# Patient Record
Sex: Female | Born: 1990 | Race: Asian | Hispanic: No | Marital: Married | Smoking: Never smoker
Health system: Southern US, Community
[De-identification: ages and names within clinical notes are randomized; demographics above are authoritative.]

## PROBLEM LIST (undated history)

## (undated) ENCOUNTER — Inpatient Hospital Stay (HOSPITAL_COMMUNITY): Payer: Self-pay

## (undated) DIAGNOSIS — R519 Headache, unspecified: Secondary | ICD-10-CM

## (undated) DIAGNOSIS — A749 Chlamydial infection, unspecified: Secondary | ICD-10-CM

## (undated) DIAGNOSIS — R51 Headache: Secondary | ICD-10-CM

## (undated) DIAGNOSIS — I10 Essential (primary) hypertension: Secondary | ICD-10-CM

## (undated) HISTORY — DX: Essential (primary) hypertension: I10

## (undated) HISTORY — PX: WISDOM TOOTH EXTRACTION: SHX21

## (undated) HISTORY — PX: TONSILLECTOMY: SUR1361

---

## 2017-05-15 DIAGNOSIS — I10 Essential (primary) hypertension: Secondary | ICD-10-CM

## 2017-05-15 HISTORY — DX: Essential (primary) hypertension: I10

## 2017-05-19 ENCOUNTER — Encounter (HOSPITAL_COMMUNITY): Payer: Self-pay | Admitting: *Deleted

## 2017-05-19 ENCOUNTER — Inpatient Hospital Stay (HOSPITAL_COMMUNITY)
Admission: AD | Admit: 2017-05-19 | Discharge: 2017-05-19 | Disposition: A | Discharge status: Home / Self Care | Payer: Medicaid Other | Source: Ambulatory Visit | Attending: Obstetrics and Gynecology | Admitting: Obstetrics and Gynecology

## 2017-05-19 ENCOUNTER — Inpatient Hospital Stay (HOSPITAL_COMMUNITY): Payer: Medicaid Other

## 2017-05-19 DIAGNOSIS — O468X1 Other antepartum hemorrhage, first trimester: Secondary | ICD-10-CM | POA: Diagnosis not present

## 2017-05-19 DIAGNOSIS — O468X9 Other antepartum hemorrhage, unspecified trimester: Secondary | ICD-10-CM | POA: Diagnosis present

## 2017-05-19 DIAGNOSIS — O209 Hemorrhage in early pregnancy, unspecified: Secondary | ICD-10-CM | POA: Diagnosis present

## 2017-05-19 DIAGNOSIS — O418X10 Other specified disorders of amniotic fluid and membranes, first trimester, not applicable or unspecified: Secondary | ICD-10-CM

## 2017-05-19 DIAGNOSIS — O200 Threatened abortion: Secondary | ICD-10-CM | POA: Insufficient documentation

## 2017-05-19 DIAGNOSIS — Z9889 Other specified postprocedural states: Secondary | ICD-10-CM | POA: Insufficient documentation

## 2017-05-19 DIAGNOSIS — Z8249 Family history of ischemic heart disease and other diseases of the circulatory system: Secondary | ICD-10-CM | POA: Diagnosis not present

## 2017-05-19 DIAGNOSIS — O418X90 Other specified disorders of amniotic fluid and membranes, unspecified trimester, not applicable or unspecified: Secondary | ICD-10-CM | POA: Diagnosis present

## 2017-05-19 DIAGNOSIS — O208 Other hemorrhage in early pregnancy: Secondary | ICD-10-CM | POA: Diagnosis not present

## 2017-05-19 DIAGNOSIS — Z888 Allergy status to other drugs, medicaments and biological substances status: Secondary | ICD-10-CM | POA: Diagnosis not present

## 2017-05-19 DIAGNOSIS — Z3A01 Less than 8 weeks gestation of pregnancy: Secondary | ICD-10-CM | POA: Diagnosis not present

## 2017-05-19 DIAGNOSIS — O26899 Other specified pregnancy related conditions, unspecified trimester: Secondary | ICD-10-CM

## 2017-05-19 DIAGNOSIS — R109 Unspecified abdominal pain: Secondary | ICD-10-CM

## 2017-05-19 HISTORY — DX: Chlamydial infection, unspecified: A74.9

## 2017-05-19 HISTORY — DX: Headache, unspecified: R51.9

## 2017-05-19 HISTORY — DX: Headache: R51

## 2017-05-19 LAB — HCG, QUANTITATIVE, PREGNANCY: hCG, Beta Chain, Quant, S: 17574 m[IU]/mL — ABNORMAL HIGH (ref <5)

## 2017-05-19 LAB — URINALYSIS, ROUTINE W REFLEX MICROSCOPIC
BILIRUBIN URINE: NEGATIVE
GLUCOSE, UA: NEGATIVE mg/dL
Ketones, ur: NEGATIVE mg/dL
NITRITE: NEGATIVE
Protein, ur: 30 mg/dL — AB
SPECIFIC GRAVITY, URINE: 1.019 (ref 1.005–1.030)
pH: 5 (ref 5.0–8.0)

## 2017-05-19 LAB — CBC
HEMATOCRIT: 39.5 % (ref 36.0–46.0)
HEMOGLOBIN: 13.3 g/dL (ref 12.0–15.0)
MCH: 29.4 pg (ref 26.0–34.0)
MCHC: 33.7 g/dL (ref 30.0–36.0)
MCV: 87.4 fL (ref 78.0–100.0)
Platelets: 246 10*3/uL (ref 150–400)
RBC: 4.52 MIL/uL (ref 3.87–5.11)
RDW: 13.4 % (ref 11.5–15.5)
WBC: 9.3 10*3/uL (ref 4.0–10.5)

## 2017-05-19 LAB — WET PREP, GENITAL
CLUE CELLS WET PREP: NONE SEEN
SPERM: NONE SEEN
Trich, Wet Prep: NONE SEEN
Yeast Wet Prep HPF POC: NONE SEEN

## 2017-05-19 LAB — POCT PREGNANCY, URINE: Preg Test, Ur: POSITIVE — AB

## 2017-05-19 LAB — ABO/RH: ABO/RH(D): O POS

## 2017-05-19 NOTE — MAU Provider Note (Signed)
History     CSN: [id number]  Arrival date and time: 05/19/17 2423   First Provider Initiated Contact with Patient 05/19/17 [phone]      Chief Complaint  Patient presents with  . Abdominal Pain  . Vaginal Bleeding  . Possible Pregnancy   HPI  [name] is a 26 yo G2P1001 at 6.[redacted] wks gestation by LMP presenting to MAU with complaints of bad abd cramping x 2 wks and spotting since 9/1.  She reports the bleeding has increased to more like a period this AM.  Past Medical History:  Diagnosis Date  . Chlamydia   . Headache     Past Surgical History:  Procedure Laterality Date  . TONSILLECTOMY    . WISDOM TOOTH EXTRACTION      Family History  Problem Relation Age of Onset  . Heart disease Father     Social History  Substance Use Topics  . Smoking status: Never Smoker  . Smokeless tobacco: Never Used  . Alcohol use Yes     Comment: rarely    Allergies:  Allergies  Allergen Reactions  . Cefzil [Cefprozil] Hives    Coughing, fever    No prescriptions prior to admission.    Review of Systems  Constitutional: Negative.   HENT: Negative.   Eyes: Negative.   Respiratory: Negative.   Cardiovascular: Negative.   Gastrointestinal: Positive for abdominal pain.  Endocrine: Negative.   Genitourinary: Positive for vaginal bleeding.  Musculoskeletal: Negative.   Skin: Negative.   Allergic/Immunologic: Negative.   Neurological: Negative.   Hematological: Negative.   Psychiatric/Behavioral: Negative.    Physical Exam   Blood pressure 140/71, pulse 75, temperature 98 F (36.7 C), temperature source Oral, resp. rate 16, height 5\' 5"  (1.651 m), weight 109.3 kg (241 lb), last menstrual period 04/07/2017, SpO2 100 %.  Physical Exam  Constitutional: She is oriented to person, place, and time. She appears well-developed and well-nourished.  HENT:  Head: Normocephalic.  Eyes: Pupils are equal, round, and reactive to light.  Neck: Normal range of motion.   Cardiovascular: Normal rate, regular rhythm and normal heart sounds.   Respiratory: Effort normal and breath sounds normal.  GI: Soft. Bowel sounds are normal.  Musculoskeletal: Normal range of motion.  Neurological: She is alert and oriented to person, place, and time.  Skin: Skin is warm and dry.  Psychiatric: She has a normal mood and affect. Her behavior is normal. Judgment and thought content normal.    MAU Course  Procedures  MDM CCUA UPT CBC ABO/Rh HCG OB U/S <14 wks OB U/S Transvaginal  Results for orders placed or performed during the hospital encounter of 05/19/17 (from the past 24 hour(s))  Urinalysis, Routine w reflex microscopic     Status: Abnormal   Collection Time: 05/19/17  8:59 AM  Result Value Ref Range   Color, Urine YELLOW YELLOW   APPearance CLOUDY (A) CLEAR   Specific Gravity, Urine 1.019 1.005 - 1.030   pH 5.0 5.0 - 8.0   Glucose, UA NEGATIVE NEGATIVE mg/dL   Hgb urine dipstick LARGE (A) NEGATIVE   Bilirubin Urine NEGATIVE NEGATIVE   Ketones, ur NEGATIVE NEGATIVE mg/dL   Protein, ur 30 (A) NEGATIVE mg/dL   Nitrite NEGATIVE NEGATIVE   Leukocytes, UA LARGE (A) NEGATIVE   RBC / HPF 6-30 0 - 5 RBC/hpf   WBC, UA TOO NUMEROUS TO COUNT 0 - 5 WBC/hpf   Bacteria, UA FEW (A) NONE SEEN   Squamous Epithelial / LPF 6-30 (A)  NONE SEEN   Mucus PRESENT   Pregnancy, urine POC     Status: Abnormal   Collection Time: 05/19/17  9:03 AM  Result Value Ref Range   Preg Test, Ur POSITIVE (A) NEGATIVE  CBC     Status: None   Collection Time: 05/19/17  9:36 AM  Result Value Ref Range   WBC 9.3 4.0 - 10.5 K/uL   RBC 4.52 3.87 - 5.11 MIL/uL   Hemoglobin 13.3 12.0 - 15.0 g/dL   HCT 39.5 36.0 - 46.0 %   MCV 87.4 78.0 - 100.0 fL   MCH 29.4 26.0 - 34.0 pg   MCHC 33.7 30.0 - 36.0 g/dL   RDW 13.4 11.5 - 15.5 %   Platelets 246 150 - 400 K/uL  ABO/Rh     Status: None (Preliminary result)   Collection Time: 05/19/17  9:36 AM  Result Value Ref Range   ABO/RH(D) O POS    hCG, quantitative, pregnancy     Status: Abnormal   Collection Time: 05/19/17  9:36 AM  Result Value Ref Range   hCG, Beta Chain, Quant, S 17,574 (H) <5 mIU/mL  Wet prep, genital     Status: Abnormal   Collection Time: 05/19/17  9:36 AM  Result Value Ref Range   Yeast Wet Prep HPF POC NONE SEEN NONE SEEN   Trich, Wet Prep NONE SEEN NONE SEEN   Clue Cells Wet Prep HPF POC NONE SEEN NONE SEEN   WBC, Wet Prep HPF POC MANY (A) NONE SEEN   Sperm NONE SEEN    US Ob Comp Less 14 Wks  Result Date: 05/19/2017 CLINICAL DATA:  Bleeding in early pregnancy, abdominal pain in pregnancy, quantitative beta HCG = 17,574 EXAM: OBSTETRIC <14 WK [place] AND TRANSVAGINAL OB US TECHNIQUE: Both transabdominal and transvaginal ultrasound examinations were performed for complete evaluation of the gestation as well as the maternal uterus, adnexal regions, and pelvic cul-de-sac. Transvaginal technique was performed to assess early pregnancy. COMPARISON:  None FINDINGS: Intrauterine gestational sac: Present Yolk sac:  Present Embryo:  Present Cardiac Activity: Present Heart Rate: 116  bpm CRL:  2.4  mm   5 w   5 d                  [place] EDC: 01/14/2018 Subchorionic hemorrhage:  Large subchronic hemorrhage Maternal uterus/adnexae: RIGHT ovary normal size and morphology 3.0 x 3.4 x 3.1 cm. LEFT ovary normal size and morphology 2.2 x 2.8 x 1.7 cm. No free pelvic fluid or adnexal masses. IMPRESSION: Single live intrauterine gestation as above. Large subchronic hemorrhage. Electronically Signed   By: Lavonia Dana M.D.   On: 05/19/2017 11:15   US Ob Transvaginal  Result Date: 05/19/2017 CLINICAL DATA:  Bleeding in early pregnancy, abdominal pain in pregnancy, quantitative beta HCG = 17,574 EXAM: OBSTETRIC <14 WK [place] AND TRANSVAGINAL OB US TECHNIQUE: Both transabdominal and transvaginal ultrasound examinations were performed for complete evaluation of the gestation as well as the maternal uterus, adnexal regions, and pelvic cul-de-sac.  Transvaginal technique was performed to assess early pregnancy. COMPARISON:  None FINDINGS: Intrauterine gestational sac: Present Yolk sac:  Present Embryo:  Present Cardiac Activity: Present Heart Rate: 116  bpm CRL:  2.4  mm   5 w   5 d                  [place] EDC: 01/14/2018 Subchorionic hemorrhage:  Large subchronic hemorrhage Maternal uterus/adnexae: RIGHT ovary normal size and morphology 3.0 x 3.4 x  3.1 cm. LEFT ovary normal size and morphology 2.2 x 2.8 x 1.7 cm. No free pelvic fluid or adnexal masses. IMPRESSION: Single live intrauterine gestation as above. Large subchronic hemorrhage. Electronically Signed   By: Lavonia Dana M.D.   On: 05/19/2017 11:15    Assessment and Plan  Subchorionic hemorrhage of placenta in first trimester, single or unspecified fetus - Instructions on [facility] and threatened miscarriage given - Bleeding precautions discussed  Abdominal pain affecting pregnancy - Advised to take Tylenol 1000 mg every 6 hrs prn pain - Advised to return to MAU for increased pain not relieved by Tylenol  Discharge home Patient verbalized an understanding of the plan of care and agrees.   Laury Deep, MSN, CNM 05/19/2017, 9:39 AM

## 2017-05-19 NOTE — MAU Note (Signed)
+  HPT last Friday.  Started spotting on Sat. Has had bad cramps for the past 2 wks.  Bleeding increased more like a period this morning.

## 2017-05-19 NOTE — Discharge Instructions (Signed)
You can take Tylenol 1000 mg every 6 hours as needed for pain   [place] for [employer] at [facility]       Phone: [facility] for [place] at [place] Phone: [place] for [place] at [place]  Phone: [place] for [place] at [facility]  Phone: [place] for [place] at [place]  Phone: [facility]       Phone: [phone]  [facility]    Phone: [phone]   [facility] [place])    Phone: [facility]    Phone: [phone]  [facility]    Phone: [phone]  [place]    Phone: [phone]  [place] [facility]       Phone: [phone]   [place] [facility]  Phone: [place]    Phone: [phone]  [facility]   Phone: [phone]  [facility]      Phone: [phone]  [facility]    Phone: [phone]

## 2017-05-20 LAB — GC/CHLAMYDIA PROBE AMP (~~LOC~~) NOT AT ARMC
Chlamydia: NEGATIVE
Neisseria Gonorrhea: NEGATIVE

## 2017-06-09 ENCOUNTER — Encounter (HOSPITAL_COMMUNITY): Payer: Self-pay | Admitting: *Deleted

## 2017-06-09 ENCOUNTER — Inpatient Hospital Stay (HOSPITAL_COMMUNITY)
Admission: AD | Admit: 2017-06-09 | Discharge: 2017-06-09 | Disposition: A | Discharge status: Home / Self Care | Payer: Medicaid Other | Source: Ambulatory Visit | Attending: Family Medicine | Admitting: Family Medicine

## 2017-06-09 DIAGNOSIS — Z3A09 9 weeks gestation of pregnancy: Secondary | ICD-10-CM | POA: Diagnosis not present

## 2017-06-09 DIAGNOSIS — N930 Postcoital and contact bleeding: Secondary | ICD-10-CM

## 2017-06-09 DIAGNOSIS — O209 Hemorrhage in early pregnancy, unspecified: Secondary | ICD-10-CM | POA: Diagnosis not present

## 2017-06-09 DIAGNOSIS — O26851 Spotting complicating pregnancy, first trimester: Secondary | ICD-10-CM | POA: Diagnosis present

## 2017-06-09 LAB — URINALYSIS, ROUTINE W REFLEX MICROSCOPIC
BILIRUBIN URINE: NEGATIVE
Glucose, UA: NEGATIVE mg/dL
Ketones, ur: NEGATIVE mg/dL
Nitrite: NEGATIVE
PROTEIN: 30 mg/dL — AB
SPECIFIC GRAVITY, URINE: 1.021 (ref 1.005–1.030)
pH: 6 (ref 5.0–8.0)

## 2017-06-09 NOTE — MAU Note (Signed)
Started having vaginal bleeding again last night; wearing a pad; more noticeable when wipes. Red in color. No clots.   Abdominal cramping--rating pain 2/10

## 2017-06-09 NOTE — MAU Provider Note (Signed)
History     CSN: [id number]  Arrival date and time: 06/09/17 0909   First Provider Initiated Contact with Patient 06/09/17 [phone]      Chief Complaint  Patient presents with  . Vaginal Bleeding   Amber Dominguez is a 26 y.o. G2P1001 at [redacted]w[redacted]d who presents today with spotting since yesterday. She has had intercourse with in the 24 hours prior to the bleeding starting. She has minimal pain. She has documented IUP on [place] on 05/19/17.    Vaginal Bleeding  The patient's primary symptoms include pelvic pain and vaginal bleeding. This is a new problem. The current episode started yesterday. The problem occurs constantly. The problem has been unchanged. Pain severity now: 3/10  She is pregnant. Associated symptoms include nausea. Pertinent negatives include no chills, dysuria, fever, frequency, urgency or vomiting. The vaginal discharge was bloody. The vaginal bleeding is lighter than menses. She has not been passing clots. She has not been passing tissue. Nothing aggravates the symptoms. She has tried nothing for the symptoms. Sexual activity: has had intercourse in the last 48 hours.  Menstrual history: LMP 04/07/17     Past Medical History:  Diagnosis Date  . Chlamydia   . Headache     Past Surgical History:  Procedure Laterality Date  . TONSILLECTOMY    . WISDOM TOOTH EXTRACTION      Family History  Problem Relation Age of Onset  . Heart disease Father     Social History  Substance Use Topics  . Smoking status: Never Smoker  . Smokeless tobacco: Never Used  . Alcohol use Yes     Comment: rarely    Allergies:  Allergies  Allergen Reactions  . Cefzil [Cefprozil] Hives    Coughing, fever    Prescriptions Prior to Admission  Medication Sig Dispense Refill Last Dose  . acetaminophen (TYLENOL) 500 MG tablet Take 1,000 mg by mouth every 6 (six) hours as needed for mild pain or headache.   06/08/2017 at Unknown time  . Prenatal Vit-Fe Fumarate-FA (PRENATAL MULTIVITAMIN) TABS  tablet Take 1 tablet by mouth daily at 12 noon.   06/09/2017 at Unknown time    Review of Systems  Constitutional: Negative for chills and fever.  Gastrointestinal: Positive for nausea. Negative for vomiting.  Genitourinary: Positive for pelvic pain and vaginal bleeding. Negative for dysuria, frequency and urgency.   Physical Exam   Blood pressure (!) 141/64, pulse (!) 110, temperature 98.2 F (36.8 C), temperature source Oral, resp. rate 16, weight 238 lb 0.6 oz (108 kg), last menstrual period 04/07/2017, SpO2 100 %.  Physical Exam  Nursing note and vitals reviewed. Constitutional: She is oriented to person, place, and time. She appears well-developed and well-nourished. No distress.  HENT:  Head: Normocephalic.  Cardiovascular: Normal rate.   Respiratory: Effort normal.  GI: Soft. There is no tenderness. There is no rebound.  Neurological: She is alert and oriented to person, place, and time.  Skin: Skin is warm and dry.  Psychiatric: She has a normal mood and affect.   Bedside US: +[facility] MAU Course  Procedures  MDM   Assessment and Plan   1. Vaginal bleeding in pregnancy, first trimester   2. Postcoital bleeding   3. [redacted] weeks gestation of pregnancy    DC home Comfort measures reviewed  1st Trimester precautions  Bleeding precautions RX: none  Return to MAU as needed FU with OB as planned  [place] Follow up.  Specialty:  Obstetrics and Gynecology Contact information: [address] [place] 06/09/2017, 10:22 AM

## 2017-06-09 NOTE — Discharge Instructions (Signed)
First Trimester of Pregnancy The first trimester of pregnancy is from week 1 until the end of week 13 (months 1 through 3). A week after a sperm fertilizes an egg, the egg will implant on the wall of the uterus. This embryo will begin to develop into a baby. Genes from you and your partner will form the baby. The female genes will determine whether the baby will be a boy or a girl. At 6-8 weeks, the eyes and face will be formed, and the heartbeat can be seen on ultrasound. At the end of 12 weeks, all the baby's organs will be formed. Now that you are pregnant, you will want to do everything you can to have a healthy baby. Two of the most important things are to get good prenatal care and to follow your health care provider's instructions. Prenatal care is all the medical care you receive before the baby's birth. This care will help prevent, find, and treat any problems during the pregnancy and childbirth. Body changes during your first trimester Your body goes through many changes during pregnancy. The changes vary from woman to woman.  You may gain or lose a couple of pounds at first.  You may feel sick to your stomach (nauseous) and you may throw up (vomit). If the vomiting is uncontrollable, call your health care provider.  You may tire easily.  You may develop headaches that can be relieved by medicines. All medicines should be approved by your health care provider.  You may urinate more often. Painful urination may mean you have a bladder infection.  You may develop heartburn as a result of your pregnancy.  You may develop constipation because certain hormones are causing the muscles that push stool through your intestines to slow down.  You may develop hemorrhoids or swollen veins (varicose veins).  Your breasts may begin to grow larger and become tender. Your nipples may stick out more, and the tissue that surrounds them (areola) may become darker.  Your gums may bleed and may be  sensitive to brushing and flossing.  Dark spots or blotches (chloasma, mask of pregnancy) may develop on your face. This will likely fade after the baby is born.  Your menstrual periods will stop.  You may have a loss of appetite.  You may develop cravings for certain kinds of food.  You may have changes in your emotions from day to day, such as being excited to be pregnant or being concerned that something may go wrong with the pregnancy and baby.  You may have more vivid and strange dreams.  You may have changes in your hair. These can include thickening of your hair, rapid growth, and changes in texture. Some women also have hair loss during or after pregnancy, or hair that feels dry or thin. Your hair will most likely return to normal after your baby is born.  What to expect at prenatal visits During a routine prenatal visit:  You will be weighed to make sure you and the baby are growing normally.  Your blood pressure will be taken.  Your abdomen will be measured to track your baby's growth.  The fetal heartbeat will be listened to between weeks 10 and 14 of your pregnancy.  Test results from any previous visits will be discussed.  Your health care provider may ask you:  How you are feeling.  If you are feeling the baby move.  If you have had any abnormal symptoms, such as leaking fluid, bleeding, severe headaches,   or abdominal cramping.  If you are using any tobacco products, including cigarettes, chewing tobacco, and electronic cigarettes.  If you have any questions.  Other tests that may be performed during your first trimester include:  Blood tests to find your blood type and to check for the presence of any previous infections. The tests will also be used to check for low iron levels (anemia) and protein on red blood cells (Rh antibodies). Depending on your risk factors, or if you previously had diabetes during pregnancy, you may have tests to check for high blood  sugar that affects pregnant women (gestational diabetes).  Urine tests to check for infections, diabetes, or protein in the urine.  An ultrasound to confirm the proper growth and development of the baby.  Fetal screens for spinal cord problems (spina bifida) and Down syndrome.  HIV (human immunodeficiency virus) testing. Routine prenatal testing includes screening for HIV, unless you choose not to have this test.  You may need other tests to make sure you and the baby are doing well.  Follow these instructions at home: Medicines  Follow your health care provider's instructions regarding medicine use. Specific medicines may be either safe or unsafe to take during pregnancy.  Take a prenatal vitamin that contains at least 600 micrograms (mcg) of folic acid.  If you develop constipation, try taking a stool softener if your health care provider approves. Eating and drinking  Eat a balanced diet that includes fresh fruits and vegetables, whole grains, good sources of protein such as meat, eggs, or tofu, and low-fat dairy. Your health care provider will help you determine the amount of weight gain that is right for you.  Avoid raw meat and uncooked cheese. These carry germs that can cause birth defects in the baby.  Eating four or five small meals rather than three large meals a day may help relieve nausea and vomiting. If you start to feel nauseous, eating a few soda crackers can be helpful. Drinking liquids between meals, instead of during meals, also seems to help ease nausea and vomiting.  Limit foods that are high in fat and processed sugars, such as fried and sweet foods.  To prevent constipation: ? Eat foods that are high in fiber, such as fresh fruits and vegetables, whole grains, and beans. ? Drink enough fluid to keep your urine clear or pale yellow. Activity  Exercise only as directed by your health care provider. Most women can continue their usual exercise routine during  pregnancy. Try to exercise for 30 minutes at least 5 days a week. Exercising will help you: ? Control your weight. ? Stay in shape. ? Be prepared for labor and delivery.  Experiencing pain or cramping in the lower abdomen or lower back is a good sign that you should stop exercising. Check with your health care provider before continuing with normal exercises.  Try to avoid standing for long periods of time. Move your legs often if you must stand in one place for a long time.  Avoid heavy lifting.  Wear low-heeled shoes and practice good posture.  You may continue to have sex unless your health care provider tells you not to. Relieving pain and discomfort  Wear a good support bra to relieve breast tenderness.  Take warm sitz baths to soothe any pain or discomfort caused by hemorrhoids. Use hemorrhoid cream if your health care provider approves.  Rest with your legs elevated if you have leg cramps or low back pain.  If you develop   varicose veins in your legs, wear support hose. Elevate your feet for 15 minutes, 3-4 times a day. Limit salt in your diet. Prenatal care  Schedule your prenatal visits by the twelfth week of pregnancy. They are usually scheduled monthly at first, then more often in the last 2 months before delivery.  Write down your questions. Take them to your prenatal visits.  Keep all your prenatal visits as told by your health care provider. This is important. Safety  Wear your seat belt at all times when driving.  Make a list of emergency phone numbers, including numbers for family, friends, the hospital, and police and fire departments. General instructions  Ask your health care provider for a referral to a local prenatal education class. Begin classes no later than the beginning of month 6 of your pregnancy.  Ask for help if you have counseling or nutritional needs during pregnancy. Your health care provider can offer advice or refer you to specialists for help  with various needs.  Do not use hot tubs, steam rooms, or saunas.  Do not douche or use tampons or scented sanitary pads.  Do not cross your legs for long periods of time.  Avoid cat litter boxes and soil used by cats. These carry germs that can cause birth defects in the baby and possibly loss of the fetus by miscarriage or stillbirth.  Avoid all smoking, herbs, alcohol, and medicines not prescribed by your health care provider. Chemicals in these products affect the formation and growth of the baby.  Do not use any products that contain nicotine or tobacco, such as cigarettes and e-cigarettes. If you need help quitting, ask your health care provider. You may receive counseling support and other resources to help you quit.  Schedule a dentist appointment. At home, brush your teeth with a soft toothbrush and be gentle when you floss. Contact a health care provider if:  You have dizziness.  You have mild pelvic cramps, pelvic pressure, or nagging pain in the abdominal area.  You have persistent nausea, vomiting, or diarrhea.  You have a bad smelling vaginal discharge.  You have pain when you urinate.  You notice increased swelling in your face, hands, legs, or ankles.  You are exposed to fifth disease or chickenpox.  You are exposed to German measles (rubella) and have never had it. Get help right away if:  You have a fever.  You are leaking fluid from your vagina.  You have spotting or bleeding from your vagina.  You have severe abdominal cramping or pain.  You have rapid weight gain or loss.  You vomit blood or material that looks like coffee grounds.  You develop a severe headache.  You have shortness of breath.  You have any kind of trauma, such as from a fall or a car accident. Summary  The first trimester of pregnancy is from week 1 until the end of week 13 (months 1 through 3).  Your body goes through many changes during pregnancy. The changes vary from  woman to woman.  You will have routine prenatal visits. During those visits, your health care provider will examine you, discuss any test results you may have, and talk with you about how you are feeling. This information is not intended to replace advice given to you by your health care provider. Make sure you discuss any questions you have with your health care provider. Document Released: 08/25/2001 Document Revised: 08/12/2016 Document Reviewed: 08/12/2016 Elsevier Interactive Patient Education  2017 [employer]  

## 2017-06-28 ENCOUNTER — Other Ambulatory Visit (HOSPITAL_COMMUNITY)
Admission: RE | Admit: 2017-06-28 | Discharge: 2017-06-28 | Disposition: A | Discharge status: Home / Self Care | Payer: Medicaid Other | Source: Ambulatory Visit | Attending: Certified Nurse Midwife | Admitting: Certified Nurse Midwife

## 2017-06-28 ENCOUNTER — Ambulatory Visit (INDEPENDENT_AMBULATORY_CARE_PROVIDER_SITE_OTHER): Payer: Medicaid Other | Admitting: Certified Nurse Midwife

## 2017-06-28 ENCOUNTER — Encounter: Payer: Self-pay | Admitting: Certified Nurse Midwife

## 2017-06-28 VITALS — BP 125/85 | HR 80 | Wt 238.8 lb

## 2017-06-28 DIAGNOSIS — O0991 Supervision of high risk pregnancy, unspecified, first trimester: Secondary | ICD-10-CM | POA: Diagnosis not present

## 2017-06-28 DIAGNOSIS — O10919 Unspecified pre-existing hypertension complicating pregnancy, unspecified trimester: Secondary | ICD-10-CM

## 2017-06-28 DIAGNOSIS — O10911 Unspecified pre-existing hypertension complicating pregnancy, first trimester: Secondary | ICD-10-CM

## 2017-06-28 DIAGNOSIS — O219 Vomiting of pregnancy, unspecified: Secondary | ICD-10-CM

## 2017-06-28 DIAGNOSIS — O0990 Supervision of high risk pregnancy, unspecified, unspecified trimester: Secondary | ICD-10-CM | POA: Insufficient documentation

## 2017-06-28 HISTORY — DX: Unspecified pre-existing hypertension complicating pregnancy, unspecified trimester: O10.919

## 2017-06-28 MED ORDER — PRENATE PIXIE 10-0.6-0.4-200 MG PO CAPS: 1.0000 | ORAL_CAPSULE | Freq: Every day | ORAL | 12 refills | Status: DC

## 2017-06-28 MED ORDER — DOXYLAMINE-PYRIDOXINE 10-10 MG PO TBEC: DELAYED_RELEASE_TABLET | ORAL | 4 refills | Status: DC

## 2017-06-28 MED ORDER — ASPIRIN 81 MG PO CHEW: 81.0000 mg | CHEWABLE_TABLET | Freq: Every day | ORAL | 12 refills | Status: DC

## 2017-06-28 NOTE — Progress Notes (Signed)
Subjective:    Amber Dominguez is being seen today for her first obstetrical visit.  This is a planned pregnancy. She is at 48w3dgestation. Her obstetrical history is significant for obesity and chronic hypertension . Relationship with FOB: significant other, living together. Patient does intend to breast feed. Pregnancy history fully reviewed.  Has 42year old son, [place]   Engaged.  Not currently employed.    The information documented in the HPI was reviewed and verified.  Menstrual History: OB History    Gravida Para Term Preterm AB Living   _0 SAB TAB Ectopic Multiple Live Births           1       Patient's last menstrual period was 04/07/2017 (exact date).    Past Medical History:  Diagnosis Date  . Chlamydia   . Headache     Past Surgical History:  Procedure Laterality Date  . TONSILLECTOMY    . WISDOM TOOTH EXTRACTION       (Not in a hospital admission) Allergies  Allergen Reactions  . Cefzil [Cefprozil] Hives    Coughing, fever    Social History  Substance Use Topics  . Smoking status: Never Smoker  . Smokeless tobacco: Never Used  . Alcohol use Yes     Comment: rarely    Family History  Problem Relation Age of Onset  . Heart disease Father      Review of Systems Constitutional: negative for weight loss Gastrointestinal: negative for vomiting, + nausea Genitourinary:negative for genital lesions and vaginal discharge and dysuria Musculoskeletal:negative for back pain Behavioral/Psych: negative for abusive relationship, depression, illegal drug usage and tobacco use    Objective:    BP 125/85   Pulse 80   Wt 238 lb 12.8 oz (108.3 kg)   LMP 04/07/2017 (Exact Date)   BMI 39.74 kg/m  General Appearance:    Alert, cooperative, no distress, appears stated age  Head:    Normocephalic, without obvious abnormality, atraumatic  Eyes:    PERRL, conjunctiva/corneas clear, EOM's intact, fundi    benign, both eyes  Ears:    Normal  TM's and external ear canals, both ears  Nose:   Nares normal, septum midline, mucosa normal, no drainage    or sinus tenderness  Throat:   Lips, mucosa, and tongue normal; teeth and gums normal  Neck:   Supple, symmetrical, trachea midline, no adenopathy;    thyroid:  no enlargement/tenderness/nodules; no carotid   bruit or JVD  Back:     Symmetric, no curvature, ROM normal, no CVA tenderness  Lungs:     Clear to auscultation bilaterally, respirations unlabored  Chest Wall:    No tenderness or deformity   Heart:    Regular rate and rhythm, S1 and S2 normal, no murmur, rub   or gallop  Breast Exam:    No tenderness, masses, or nipple abnormality  Abdomen:     Soft, non-tender, bowel sounds active all four quadrants,    no masses, no organomegaly  Genitalia:    Normal female without lesion, discharge or tenderness  Extremities:   Extremities normal, atraumatic, no cyanosis or edema  Pulses:   2+ and symmetric all extremities  Skin:   Skin color, texture, turgor normal, no rashes or lesions  Lymph nodes:   Cervical, supraclavicular, and axillary nodes normal  Neurologic:   CNII-XII intact, normal strength, sensation and reflexes    throughout  Cervix:  Long, thick, closed, midline.  Friable on exam.  FHR: 164 by doppler                            Pelvic rest encouraged.      Lab Review Urine pregnancy test Labs reviewed yes Radiologic studies reviewed yes  Assessment & Plan    Pregnancy at 93w3dweeks    1. Supervision of other normal pregnancy, antepartum    - Cytology - PAP - Cervicovaginal ancillary only - Culture, OB Urine - Hemoglobinopathy evaluation - HgB A1c - Vitamin D (25 hydroxy) - Obstetric Panel, Including HIV - MaterniT21 PLUS Core+SCA - Enroll Patient in Babyscripts - Cystic Fibrosis Mutation 97 - Varicella zoster antibody, IgG - Prenat-FeAsp-Meth-FA-DHA w/o A (PRENATE PIXIE) 10-0.6-0.4-200 MG CAPS; Take 1 tablet by mouth daily.  Dispense: 30 capsule;  Refill: 12  2. Maternal chronic hypertension in first trimester     - Comp Met (CMET) - Protein / creatinine ratio, urine - aspirin 81 MG chewable tablet; Chew 1 tablet (81 mg total) by mouth daily.  Dispense: 30 tablet; Refill: 12  3. Nausea/vomiting in pregnancy    - Doxylamine-Pyridoxine (DICLEGIS) 10-10 MG TBEC; Take 1 tablet with breakfast and lunch.  Take 2 tablets at bedtime.  Dispense: 100 tablet; Refill: 4  4.  Constipation    OTC Colace/Miralax   5. Friable cervix     Pelvic rest    Prenatal vitamins.  Counseling provided regarding continued use of seat belts, cessation of alcohol consumption, smoking or use of illicit drugs; infection precautions i.e., influenza/TDAP immunizations, toxoplasmosis,CMV, parvovirus, listeria and varicella; workplace safety, exercise during pregnancy; routine dental care, safe medications, sexual activity, hot tubs, saunas, pools, travel, caffeine use, fish and methlymercury, potential toxins, hair treatments, varicose veins Weight gain recommendations per IOM guidelines reviewed: underweight/BMI< 18.5--> gain 28 - 40 lbs; normal weight/BMI 18.5 - 24.9--> gain 25 - 35 lbs; overweight/BMI 25 - 29.9--> gain 15 - 25 lbs; obese/BMI >30->gain  11 - 20 lbs Problem list reviewed and updated. FIRST/CF mutation testing/NIPT/QUAD SCREEN/fragile X/Ashkenazi Jewish population testing/Spinal muscular atrophy discussed: ordered. Role of ultrasound in pregnancy discussed; fetal survey: requested. Amniocentesis discussed: not indicated.  Meds ordered this encounter  Medications  . Prenat-FeAsp-Meth-FA-DHA w/o A (PRENATE PIXIE) 10-0.6-0.4-200 MG CAPS    Sig: Take 1 tablet by mouth daily.    Dispense:  30 capsule    Refill:  12    Please process coupon: Rx BIN: [id number] RxPCN: OHCP, RxGRP:: [id number] RxID:: [id number] SUF: 01  . aspirin 81 MG chewable tablet    Sig: Chew 1 tablet (81 mg total) by mouth daily.    Dispense:  30 tablet    Refill:  12  .  Doxylamine-Pyridoxine (DICLEGIS) 10-10 MG TBEC    Sig: Take 1 tablet with breakfast and lunch.  Take 2 tablets at bedtime.    Dispense:  100 tablet    Refill:  4   Orders Placed This Encounter  Procedures  . Culture, OB Urine  . Hemoglobinopathy evaluation  . HgB A1c  . Vitamin D (25 hydroxy)  . Obstetric Panel, Including HIV  . MaterniT21 PLUS Core+SCA    Order Specific Question:   Is patient insulin dependent?    Answer:   No    Order Specific Question:   Weight (lbs)    Answer:   247   Order Specific Question:   Gestational Age (GFort Thomas, weeks  Answer:   11.3    Order Specific Question:   Date on which patient was at this [place]    Answer:   06/28/2017    Order Specific Question:   GA Calculation Method    Answer:   Ultrasound    Order Specific Question:   GA Date    Answer:   01/14/2018    Order Specific Question:   Number of fetuses    Answer:   1    Order Specific Question:   Donor egg?    Answer:   N    Order Specific Question:   Age of egg donor?    Answer:   26  . Cystic Fibrosis Mutation 97  . Varicella zoster antibody, IgG  . Comp Met (CMET)  . Protein / creatinine ratio, urine    Follow up in 4 weeks. 50% of 45 min visit spent on counseling and coordination of care.

## 2017-06-28 NOTE — Progress Notes (Signed)
Patient is in the office for initial ob visit, no complaints.

## 2017-06-29 LAB — PROTEIN / CREATININE RATIO, URINE
CREATININE, UR: 323.1 mg/dL
PROTEIN/CREAT RATIO: 149 mg/g{creat} (ref 0–200)
Protein, Ur: 48 mg/dL

## 2017-06-29 LAB — CERVICOVAGINAL ANCILLARY ONLY
Bacterial vaginitis: NEGATIVE
Candida vaginitis: POSITIVE — AB
Chlamydia: NEGATIVE
Neisseria Gonorrhea: NEGATIVE
TRICH (WINDOWPATH): NEGATIVE

## 2017-06-30 ENCOUNTER — Other Ambulatory Visit: Payer: Self-pay | Admitting: Certified Nurse Midwife

## 2017-06-30 ENCOUNTER — Telehealth: Payer: Self-pay

## 2017-06-30 DIAGNOSIS — R7989 Other specified abnormal findings of blood chemistry: Secondary | ICD-10-CM | POA: Insufficient documentation

## 2017-06-30 DIAGNOSIS — B373 Candidiasis of vulva and vagina: Secondary | ICD-10-CM

## 2017-06-30 DIAGNOSIS — B3731 Acute candidiasis of vulva and vagina: Secondary | ICD-10-CM

## 2017-06-30 DIAGNOSIS — O0990 Supervision of high risk pregnancy, unspecified, unspecified trimester: Secondary | ICD-10-CM

## 2017-06-30 LAB — COMPREHENSIVE METABOLIC PANEL
ALT: 25 IU/L (ref 0–32)
AST: 21 IU/L (ref 0–40)
Albumin/Globulin Ratio: 1.5 (ref 1.2–2.2)
Albumin: 4.2 g/dL (ref 3.5–5.5)
Alkaline Phosphatase: 75 IU/L (ref 39–117)
BUN/Creatinine Ratio: 10 (ref 9–23)
BUN: 6 mg/dL (ref 6–20)
Bilirubin Total: 0.3 mg/dL (ref 0.0–1.2)
CALCIUM: 9.7 mg/dL (ref 8.7–10.2)
CO2: 19 mmol/L — AB (ref 20–29)
CREATININE: 0.6 mg/dL (ref 0.57–1.00)
Chloride: 103 mmol/L (ref 96–106)
GFR calc Af Amer: 144 mL/min/{1.73_m2} (ref >59)
GFR, EST NON AFRICAN AMERICAN: 125 mL/min/{1.73_m2} (ref >59)
GLUCOSE: 78 mg/dL (ref 65–99)
Globulin, Total: 2.8 g/dL (ref 1.5–4.5)
POTASSIUM: 3.9 mmol/L (ref 3.5–5.2)
Sodium: 138 mmol/L (ref 134–144)
Total Protein: 7 g/dL (ref 6.0–8.5)

## 2017-06-30 LAB — HEMOGLOBINOPATHY EVALUATION
HEMOGLOBIN A2 QUANTITATION: 2.5 % (ref 1.8–3.2)
HGB C: 0 %
HGB S: 0 %
HGB VARIANT: 0 %
Hemoglobin F Quantitation: 0 % (ref 0.0–2.0)
Hgb A: 97.5 % (ref 96.4–98.8)

## 2017-06-30 LAB — OBSTETRIC PANEL, INCLUDING HIV
Antibody Screen: NEGATIVE
Basophils Absolute: 0 10*3/uL (ref 0.0–0.2)
Basos: 0 %
EOS (ABSOLUTE): 0.1 10*3/uL (ref 0.0–0.4)
Eos: 1 %
HEMOGLOBIN: 13.2 g/dL (ref 11.1–15.9)
HIV Screen 4th Generation wRfx: NONREACTIVE
Hematocrit: 39.5 % (ref 34.0–46.6)
Hepatitis B Surface Ag: NEGATIVE
IMMATURE GRANS (ABS): 0 10*3/uL (ref 0.0–0.1)
IMMATURE GRANULOCYTES: 0 %
LYMPHS: 21 %
Lymphocytes Absolute: 2 10*3/uL (ref 0.7–3.1)
MCH: 29 pg (ref 26.6–33.0)
MCHC: 33.4 g/dL (ref 31.5–35.7)
MCV: 87 fL (ref 79–97)
MONOS ABS: 0.7 10*3/uL (ref 0.1–0.9)
Monocytes: 8 %
NEUTROS PCT: 70 %
Neutrophils Absolute: 6.6 10*3/uL (ref 1.4–7.0)
Platelets: 273 10*3/uL (ref 150–379)
RBC: 4.55 x10E6/uL (ref 3.77–5.28)
RDW: 13.7 % (ref 12.3–15.4)
RPR Ser Ql: NONREACTIVE
Rh Factor: POSITIVE
Rubella Antibodies, IGG: 3.02 index (ref >0.99)
WBC: 9.4 10*3/uL (ref 3.4–10.8)

## 2017-06-30 LAB — URINE CULTURE, OB REFLEX

## 2017-06-30 LAB — CYTOLOGY - PAP: DIAGNOSIS: NEGATIVE

## 2017-06-30 LAB — CULTURE, OB URINE

## 2017-06-30 LAB — HEMOGLOBIN A1C
ESTIMATED AVERAGE GLUCOSE: 88 mg/dL
HEMOGLOBIN A1C: 4.7 % — AB (ref 4.8–5.6)

## 2017-06-30 LAB — VITAMIN D 25 HYDROXY (VIT D DEFICIENCY, FRACTURES): VIT D 25 HYDROXY: 27.8 ng/mL — AB (ref 30.0–100.0)

## 2017-06-30 LAB — VARICELLA ZOSTER ANTIBODY, IGG: VARICELLA: 2277 {index} (ref >165)

## 2017-06-30 MED ORDER — VITAMIN D (ERGOCALCIFEROL) 1.25 MG (50000 UNIT) PO CAPS: 50000.0000 [IU] | ORAL_CAPSULE | ORAL | 2 refills | Status: DC

## 2017-06-30 MED ORDER — TERCONAZOLE 0.8 % VA CREA: 1.0000 | TOPICAL_CREAM | Freq: Every day | VAGINAL | 0 refills | Status: DC

## 2017-06-30 NOTE — Telephone Encounter (Signed)
patient notified of results and RX.

## 2017-06-30 NOTE — Telephone Encounter (Signed)
-----   Message from Morene Crocker, CNM sent at 06/30/2017  8:42 AM EDT ----- Please let her know that she has a yeast infection.  Terconazole vaginal cream was sent to the pharmacy for her to use.   Please let her know that her vitamin D level is low.  Vitamin D weekly tablet has been sent to her pharmacy for her to take.  Thank you.  R.Denney CNM

## 2017-07-01 ENCOUNTER — Other Ambulatory Visit: Payer: Self-pay | Admitting: Certified Nurse Midwife

## 2017-07-01 DIAGNOSIS — O0990 Supervision of high risk pregnancy, unspecified, unspecified trimester: Secondary | ICD-10-CM

## 2017-07-03 LAB — MATERNIT21 PLUS CORE+SCA
Chromosome 13: NEGATIVE
Chromosome 18: NEGATIVE
Chromosome 21: NEGATIVE
Y Chromosome: DETECTED

## 2017-07-05 ENCOUNTER — Other Ambulatory Visit: Payer: Self-pay | Admitting: Certified Nurse Midwife

## 2017-07-05 DIAGNOSIS — O0990 Supervision of high risk pregnancy, unspecified, unspecified trimester: Secondary | ICD-10-CM

## 2017-07-06 LAB — CYSTIC FIBROSIS MUTATION 97: Interpretation: NOT DETECTED

## 2017-07-07 ENCOUNTER — Other Ambulatory Visit: Payer: Self-pay | Admitting: Certified Nurse Midwife

## 2017-07-07 DIAGNOSIS — O0990 Supervision of high risk pregnancy, unspecified, unspecified trimester: Secondary | ICD-10-CM

## 2017-07-26 ENCOUNTER — Other Ambulatory Visit: Payer: Self-pay

## 2017-07-26 ENCOUNTER — Ambulatory Visit (INDEPENDENT_AMBULATORY_CARE_PROVIDER_SITE_OTHER): Payer: Medicaid Other | Admitting: Certified Nurse Midwife

## 2017-07-26 VITALS — BP 119/72 | HR 89 | Wt 235.5 lb

## 2017-07-26 DIAGNOSIS — R519 Headache, unspecified: Secondary | ICD-10-CM

## 2017-07-26 DIAGNOSIS — R51 Headache: Secondary | ICD-10-CM

## 2017-07-26 DIAGNOSIS — O0992 Supervision of high risk pregnancy, unspecified, second trimester: Secondary | ICD-10-CM

## 2017-07-26 DIAGNOSIS — O0990 Supervision of high risk pregnancy, unspecified, unspecified trimester: Secondary | ICD-10-CM

## 2017-07-26 DIAGNOSIS — O26892 Other specified pregnancy related conditions, second trimester: Secondary | ICD-10-CM

## 2017-07-26 MED ORDER — BUTALBITAL-APAP-CAFFEINE 50-325-40 MG PO TABS: 1.0000 | ORAL_TABLET | Freq: Four times a day (QID) | ORAL | 4 refills | Status: DC | PRN

## 2017-07-26 NOTE — Progress Notes (Signed)
Headaches x 2 months 7/10.  Loud pulsing in right ear x 2 weeks

## 2017-07-26 NOTE — Progress Notes (Signed)
   PRENATAL VISIT NOTE  Subjective:  Amber Dominguez is a 26 y.o. G2P1001 at [redacted]w[redacted]d being seen today for ongoing prenatal care.  She is currently monitored for the following issues for this high-risk pregnancy and has Bleeding in early pregnancy; Subchorionic hemorrhage; Supervision of high risk pregnancy, antepartum; Maternal chronic hypertension in first trimester; and Low vitamin D level on their problem list.  Patient reports headache, no bleeding, no contractions, no cramping and no leaking.  Contractions: Not present. Vag. Bleeding: None.  Movement: Present. Denies leaking of fluid.   The following portions of the patient's history were reviewed and updated as appropriate: allergies, current medications, past family history, past medical history, past social history, past surgical history and problem list. Problem list updated.  Objective:   Vitals:   07/26/17 1051  BP: 119/72  Pulse: 89  Weight: 235 lb 8 oz (106.8 kg)    Fetal Status: Fetal Heart Rate (bpm): 133; doppler   Movement: Present     General:  Alert, oriented and cooperative. Patient is in no acute distress.  Skin: Skin is warm and dry. No rash noted.   Cardiovascular: Normal heart rate noted  Respiratory: Normal respiratory effort, no problems with respiration noted  Abdomen: Soft, gravid, appropriate for gestational age.  Pain/Pressure: Present     Pelvic: Cervical exam deferred        Extremities: Normal range of motion.  Edema: None  Mental Status:  Normal mood and affect. Normal behavior. Normal judgment and thought content.   Assessment and Plan:  Pregnancy: G2P1001 at [redacted]w[redacted]d  1. Supervision of high risk pregnancy, antepartum       - AFP, Serum, Open Spina Bifida  2. Pregnancy headache in second trimester     Has tried OTC tylenol.   - butalbital-acetaminophen-caffeine (FIORICET, ESGIC) 50-325-40 MG tablet; Take 1-2 tablets every 6 (six) hours as needed by mouth.  Dispense: 45 tablet; Refill:  4  Preterm labor symptoms and general obstetric precautions including but not limited to vaginal bleeding, contractions, leaking of fluid and fetal movement were reviewed in detail with the patient. Please refer to After Visit Summary for other counseling recommendations.  Return in about 4 weeks (around 08/23/2017) for [facility]   Morene Crocker, CNM

## 2017-07-26 NOTE — Patient Instructions (Signed)
AREA PEDIATRIC/FAMILY PRACTICE PHYSICIANS  [facility] [address], [address] [place], [place]  [address] Phone - [phone]   Fax - [phone]  [facility] [address] [place], [place] [address] Phone - [phone]   Fax - [phone]  [facility] [address] [place], [place]  [address] Phone - [phone]   Fax - [phone]  [facility] [address] [place], [place]  [address] Phone - [phone]   Fax - [phone]  [facility] [address] [place], [place]  [address] Phone - [phone]   Fax - [phone]  [facility] [address] [place], [place]  [address] Phone - [phone]   Fax - [phone]  [facility] [address] [place], [place]  [address] Phone - [phone]   Fax - [phone]  [facility] [address] [place], [place]  [address] Phone - [phone]   Fax - [phone]  [facility] [address] [place], [place]  [address] Phone - [phone]   Fax - [phone] [facility] [address] [place], [place]  [address] Phone - [phone]   Fax - [phone]  [facility] [address] [place], [place]  [address] Phone - [phone]   Fax - [phone]  [facility] [address] [place], [place]  [address] Phone - [phone]   Fax - [phone]  [facility] [address] [place], [place]  [address] Phone - [phone]   Fax - [phone]  [facility] [address] [place], [place]  [address] Phone - [phone]  [facility] [address] [place], [place]  [address] Phone - [phone]   Fax - [phone]  [facility] [address] [place], [place]  [address] Phone - [phone]   Fax - [phone]  [facility] [address] [place], [place]  [address] Phone - [phone]   Fax - [phone]  [facility] [address] [place], [place]  [address] Phone - [phone]   Fax - [phone]   [facility] [address] [place], [place]  [address] Phone - [phone]   Fax - [phone]  [facility] [address] [place], [place]  [address] Phone - [phone]   Fax - [phone]  [facility] [address] [place], [place]  [address] Phone - [phone]   Fax - [phone]  [facility] [address] [place], [place]  [address] Phone - [phone]   Fax - [phone]  [facility] [address] , [place]  [address] Phone - [phone]   Fax - [phone] [facility] [address] [place], [place]  [address] Phone - [phone]   Fax - [phone]  [facility] [address] [place], [place]  [address] Phone - [phone]   Fax - [phone]   [facility] [address] [place], [place]  [address] Phone - [phone]   Fax - [phone]  [facility] Charlene Flemming MD [address] [place] [address] Phone [phone]  Fax [phone]   

## 2017-07-29 ENCOUNTER — Other Ambulatory Visit: Payer: Self-pay | Admitting: Certified Nurse Midwife

## 2017-07-29 DIAGNOSIS — O0990 Supervision of high risk pregnancy, unspecified, unspecified trimester: Secondary | ICD-10-CM

## 2017-07-29 LAB — AFP, SERUM, OPEN SPINA BIFIDA
AFP MoM: 1.05
AFP Value: 24 ng/mL
Gest. Age on Collection Date: 15.4 weeks
Maternal Age At EDD: 28.5 yr
OSBR Risk 1 IN: 10000
Test Results:: NEGATIVE
Weight: 235 [lb_av]

## 2017-08-12 ENCOUNTER — Encounter (HOSPITAL_COMMUNITY): Payer: Self-pay | Admitting: Certified Nurse Midwife

## 2017-08-19 ENCOUNTER — Ambulatory Visit (HOSPITAL_COMMUNITY)
Admission: RE | Admit: 2017-08-19 | Discharge: 2017-08-19 | Disposition: A | Discharge status: Home / Self Care | Payer: Medicaid Other | Source: Ambulatory Visit | Attending: Certified Nurse Midwife | Admitting: Certified Nurse Midwife

## 2017-08-19 ENCOUNTER — Encounter (HOSPITAL_COMMUNITY): Payer: Self-pay

## 2017-08-19 ENCOUNTER — Other Ambulatory Visit: Payer: Self-pay | Admitting: Certified Nurse Midwife

## 2017-08-19 DIAGNOSIS — O0991 Supervision of high risk pregnancy, unspecified, first trimester: Secondary | ICD-10-CM

## 2017-08-19 DIAGNOSIS — O99212 Obesity complicating pregnancy, second trimester: Secondary | ICD-10-CM | POA: Insufficient documentation

## 2017-08-19 DIAGNOSIS — O10012 Pre-existing essential hypertension complicating pregnancy, second trimester: Secondary | ICD-10-CM | POA: Insufficient documentation

## 2017-08-19 DIAGNOSIS — Z3689 Encounter for other specified antenatal screening: Secondary | ICD-10-CM | POA: Diagnosis not present

## 2017-08-19 DIAGNOSIS — O10911 Unspecified pre-existing hypertension complicating pregnancy, first trimester: Secondary | ICD-10-CM

## 2017-08-19 DIAGNOSIS — Z3A18 18 weeks gestation of pregnancy: Secondary | ICD-10-CM | POA: Diagnosis not present

## 2017-08-20 ENCOUNTER — Other Ambulatory Visit: Payer: Self-pay | Admitting: Certified Nurse Midwife

## 2017-08-20 DIAGNOSIS — O0990 Supervision of high risk pregnancy, unspecified, unspecified trimester: Secondary | ICD-10-CM

## 2017-08-23 ENCOUNTER — Encounter: Payer: Medicaid Other | Admitting: Certified Nurse Midwife

## 2017-08-26 ENCOUNTER — Other Ambulatory Visit: Payer: Self-pay

## 2017-08-26 ENCOUNTER — Ambulatory Visit (INDEPENDENT_AMBULATORY_CARE_PROVIDER_SITE_OTHER): Payer: Medicaid Other | Admitting: Obstetrics & Gynecology

## 2017-08-26 VITALS — BP 125/71 | HR 90 | Wt 239.0 lb

## 2017-08-26 DIAGNOSIS — O0990 Supervision of high risk pregnancy, unspecified, unspecified trimester: Secondary | ICD-10-CM

## 2017-08-26 DIAGNOSIS — O10911 Unspecified pre-existing hypertension complicating pregnancy, first trimester: Secondary | ICD-10-CM

## 2017-08-26 NOTE — Progress Notes (Signed)
   PRENATAL VISIT NOTE  Subjective:  Amber Dominguez is a 26 y.o. G2P1001 at [redacted]w[redacted]d being seen today for ongoing prenatal care.  She is currently monitored for the following issues for this high-risk pregnancy and has Bleeding in early pregnancy; Subchorionic hemorrhage; Supervision of high risk pregnancy, antepartum; Maternal chronic hypertension in first trimester; Low vitamin D level; and Morbid obesity ([place]) on their problem list.  Patient reports round ligament pain, reassurance given.  Contractions: Irritability. Vag. Bleeding: None.  Movement: Present. Denies leaking of fluid.   The following portions of the patient's history were reviewed and updated as appropriate: allergies, current medications, past family history, past medical history, past social history, past surgical history and problem list. Problem list updated.  Objective:   Vitals:   08/26/17 1455  BP: 125/71  Pulse: 90  Weight: 239 lb (108.4 kg)    Fetal Status:     Movement: Present     General:  Alert, oriented and cooperative. Patient is in no acute distress.  Skin: Skin is warm and dry. No rash noted.   Cardiovascular: Normal heart rate noted  Respiratory: Normal respiratory effort, no problems with respiration noted  Abdomen: Soft, gravid, appropriate for gestational age.  Pain/Pressure: Present     Pelvic: Cervical exam deferred        Extremities: Normal range of motion.  Edema: None  Mental Status:  Normal mood and affect. Normal behavior. Normal judgment and thought content.   Assessment and Plan:  Pregnancy: G2P1001 at [redacted]w[redacted]d  1. Supervision of high risk pregnancy, antepartum   2. Maternal chronic hypertension in first trimester - baby asa until [redacted] weeks EGA  3. Morbid obesity ([place])   Preterm labor symptoms and general obstetric precautions including but not limited to vaginal bleeding, contractions, leaking of fluid and fetal movement were reviewed in detail with the patient. Please refer to  After Visit Summary for other counseling recommendations.  Return in about 4 weeks (around 09/23/2017).   Emily Filbert, MD

## 2017-08-26 NOTE — Progress Notes (Signed)
complains of abdominal cramps 3/10 x 3++ weeks. Plain water burns her throat

## 2017-09-14 NOTE — L&D Delivery Note (Addendum)
Delivery Note At 7:31 AM a viable and healthy female was delivered via Vaginal, Spontaneous (Presentation: cephalid; OA  ).  APGAR: 8, 9; Placenta status: spontaneous, intact .  Cord: 3vessel  No complications  Anesthesia:  epidural Episiotomy: None Lacerations:  none Suture Repair: N/A Est. Blood Loss (mL):    Mom to postpartum.  Baby to Couplet care / Skin to Skin.  Guadalupe Dawn 01/04/2018, 7:44 AM   I confirm that I have verified the information documented in the resident's note and that I have also personally reperformed the physical exam and all medical decision making activities.  As I entered the room, RN was delivering baby (had just left the room a short time before).  I was gloved and present for entire rest of the delivery SVD without incident No difficulty with shoulders No lacerations  Seabron Spates, CNM

## 2017-09-23 ENCOUNTER — Encounter: Payer: Self-pay | Admitting: *Deleted

## 2017-09-23 ENCOUNTER — Encounter: Payer: Self-pay | Admitting: Certified Nurse Midwife

## 2017-09-23 ENCOUNTER — Ambulatory Visit (INDEPENDENT_AMBULATORY_CARE_PROVIDER_SITE_OTHER): Payer: Medicaid Other | Admitting: Certified Nurse Midwife

## 2017-09-23 VITALS — BP 132/79 | HR 93 | Wt 240.0 lb

## 2017-09-23 DIAGNOSIS — O0992 Supervision of high risk pregnancy, unspecified, second trimester: Secondary | ICD-10-CM

## 2017-09-23 DIAGNOSIS — O0990 Supervision of high risk pregnancy, unspecified, unspecified trimester: Secondary | ICD-10-CM

## 2017-09-23 DIAGNOSIS — O10911 Unspecified pre-existing hypertension complicating pregnancy, first trimester: Secondary | ICD-10-CM

## 2017-09-23 DIAGNOSIS — O10912 Unspecified pre-existing hypertension complicating pregnancy, second trimester: Secondary | ICD-10-CM

## 2017-09-23 DIAGNOSIS — O99212 Obesity complicating pregnancy, second trimester: Secondary | ICD-10-CM

## 2017-09-23 DIAGNOSIS — R7989 Other specified abnormal findings of blood chemistry: Secondary | ICD-10-CM

## 2017-09-23 NOTE — Progress Notes (Signed)
Patient states she's been having heart palpitations that started about two weeks ago.

## 2017-09-23 NOTE — Progress Notes (Signed)
   PRENATAL VISIT NOTE  Subjective:  Amber Dominguez is a 27 y.o. G2P1001 at [redacted]w[redacted]d being seen today for ongoing prenatal care.  She is currently monitored for the following issues for this high-risk pregnancy and has Bleeding in early pregnancy; Subchorionic hemorrhage; Supervision of high risk pregnancy, antepartum; Maternal chronic hypertension in first trimester; Low vitamin D level; and Morbid obesity ([place]) on their problem list.  Patient reports no bleeding, no contractions, no cramping, no leaking and heart palpitations; no hx of heart issues mostly at night when tyring to sleep, denies SOB or feelings of imprending doom.  Contractions: Not present. Vag. Bleeding: None.  Movement: Present. Denies leaking of fluid.   The following portions of the patient's history were reviewed and updated as appropriate: allergies, current medications, past family history, past medical history, past social history, past surgical history and problem list. Problem list updated.  Objective:   Vitals:   09/23/17 0935  BP: 132/79  Pulse: 93  Weight: 240 lb (108.9 kg)    Fetal Status: Fetal Heart Rate (bpm): 145; doppler Fundal Height: 24 cm Movement: Present     General:  Alert, oriented and cooperative. Patient is in no acute distress.  Skin: Skin is warm and dry. No rash noted.   Cardiovascular: Normal heart rate noted  Respiratory: Normal respiratory effort, no problems with respiration noted  Abdomen: Soft, gravid, appropriate for gestational age.  Pain/Pressure: Present     Pelvic: Cervical exam deferred        Extremities: Normal range of motion.  Edema: None  Mental Status:  Normal mood and affect. Normal behavior. Normal judgment and thought content.   Assessment and Plan:  Pregnancy: G2P1001 at [redacted]w[redacted]d  1. Supervision of high risk pregnancy, antepartum     R/O thyroid issues for racing heart beat most likely normal physiologic changes of pregnancy.  Normotensive range pressures today: not  on meds.   - TSH Pregnancy  2. Maternal chronic hypertension in 2nd trimester     Taking baby ASA - [place] MFM FETAL BPP WO NON STRESS; Future around 28 weeks  3. Morbid obesity ([place])       1 lb weight gain this pregnancy.  Normal fetal US scan.    4. Low vitamin D level     Taking vitamin D weekly  Preterm labor symptoms and general obstetric precautions including but not limited to vaginal bleeding, contractions, leaking of fluid and fetal movement were reviewed in detail with the patient. Please refer to After Visit Summary for other counseling recommendations.  Return in about 4 weeks (around 10/21/2017) for [facility], 2 hr OGTT.   Morene Crocker, CNM

## 2017-10-21 ENCOUNTER — Other Ambulatory Visit: Payer: Medicaid Other

## 2017-10-21 ENCOUNTER — Ambulatory Visit (INDEPENDENT_AMBULATORY_CARE_PROVIDER_SITE_OTHER): Payer: Medicaid Other | Admitting: Obstetrics & Gynecology

## 2017-10-21 VITALS — BP 120/78 | HR 97 | Wt 245.8 lb

## 2017-10-21 DIAGNOSIS — Z23 Encounter for immunization: Secondary | ICD-10-CM | POA: Diagnosis not present

## 2017-10-21 DIAGNOSIS — O0992 Supervision of high risk pregnancy, unspecified, second trimester: Secondary | ICD-10-CM

## 2017-10-21 DIAGNOSIS — O10919 Unspecified pre-existing hypertension complicating pregnancy, unspecified trimester: Secondary | ICD-10-CM

## 2017-10-21 DIAGNOSIS — O0990 Supervision of high risk pregnancy, unspecified, unspecified trimester: Secondary | ICD-10-CM

## 2017-10-21 DIAGNOSIS — O10912 Unspecified pre-existing hypertension complicating pregnancy, second trimester: Secondary | ICD-10-CM

## 2017-10-21 NOTE — Progress Notes (Signed)
   PRENATAL VISIT NOTE  Subjective:  Amber Dominguez is a 27 y.o. G2P1001 at [redacted]w[redacted]d being seen today for ongoing prenatal care.  She is currently monitored for the following issues for this high-risk pregnancy and has Supervision of high risk pregnancy, antepartum; Chronic hypertension complicating pregnancy, antepartum; Low vitamin D level; and Morbid obesity ([place]) on their problem list.  Patient reports no complaints.  Contractions: Not present. Vag. Bleeding: None.  Movement: Present. Denies leaking of fluid.   The following portions of the patient's history were reviewed and updated as appropriate: allergies, current medications, past family history, past medical history, past social history, past surgical history and problem list. Problem list updated.  Objective:   Vitals:   10/21/17 0827  BP: 120/78  Pulse: 97  Weight: 245 lb 12.8 oz (111.5 kg)    Fetal Status: Fetal Heart Rate (bpm): 160 Fundal Height: 28 cm Movement: Present     General:  Alert, oriented and cooperative. Patient is in no acute distress.  Skin: Skin is warm and dry. No rash noted.   Cardiovascular: Normal heart rate noted  Respiratory: Normal respiratory effort, no problems with respiration noted  Abdomen: Soft, gravid, appropriate for gestational age.  Pain/Pressure: Present     Pelvic: Cervical exam deferred        Extremities: Normal range of motion.  Edema: None  Mental Status:  Normal mood and affect. Normal behavior. Normal judgment and thought content.   Assessment and Plan:  Pregnancy: G2P1001 at [redacted]w[redacted]d  1. Chronic hypertension complicating pregnancy, antepartum Third trimester labs today.  - Glucose Tolerance, 2 Hours w/1 Hour - CBC - HIV antibody - RPR - Comprehensive metabolic panel Will start antenatal testing at 32 weeks. Next growth scan scheduled on 10/25/17.  2. Need for tetanus, diphtheria, and acellular pertussis (Tdap) vaccine - Tdap vaccine greater than or equal to 7yo IM  3.  Supervision of high risk pregnancy, antepartum Preterm labor symptoms and general obstetric precautions including but not limited to vaginal bleeding, contractions, leaking of fluid and fetal movement were reviewed in detail with the patient. Please refer to After Visit Summary for other counseling recommendations.  Return in about 2 weeks (around 11/04/2017) for OB Visit.   Verita Schneiders, MD

## 2017-10-21 NOTE — Patient Instructions (Signed)
Return to clinic for any scheduled appointments or obstetric concerns, or go to [facility] for evaluation   Third Trimester of Pregnancy The third trimester is from week 28 through week 40 (months 7 through 9). The third trimester is a time when the unborn baby (fetus) is growing rapidly. At the end of the ninth month, the fetus is about 20 inches in length and weighs 6-10 pounds. Body changes during your third trimester Your body will continue to go through many changes during pregnancy. The changes vary from woman to woman. During the third trimester:  Your weight will continue to increase. You can expect to gain 25-35 pounds (11-16 kg) by the end of the pregnancy.  You may begin to get stretch marks on your hips, abdomen, and breasts.  You may urinate more often because the fetus is moving lower into your pelvis and pressing on your bladder.  You may develop or continue to have heartburn. This is caused by increased hormones that slow down muscles in the digestive tract.  You may develop or continue to have constipation because increased hormones slow digestion and cause the muscles that push waste through your intestines to relax.  You may develop hemorrhoids. These are swollen veins (varicose veins) in the rectum that can itch or be painful.  You may develop swollen, bulging veins (varicose veins) in your legs.  You may have increased body aches in the pelvis, back, or thighs. This is due to weight gain and increased hormones that are relaxing your joints.  You may have changes in your hair. These can include thickening of your hair, rapid growth, and changes in texture. Some women also have hair loss during or after pregnancy, or hair that feels dry or thin. Your hair will most likely return to normal after your baby is born.  Your breasts will continue to grow and they will continue to become tender. A yellow fluid (colostrum) may leak from your breasts. This is the first milk you are  producing for your baby.  Your belly button may stick out.  You may notice more swelling in your hands, face, or ankles.  You may have increased tingling or numbness in your hands, arms, and legs. The skin on your belly may also feel numb.  You may feel short of breath because of your expanding uterus.  You may have more problems sleeping. This can be caused by the size of your belly, increased need to urinate, and an increase in your body's metabolism.  You may notice the fetus "dropping," or moving lower in your abdomen (lightening).  You may have increased vaginal discharge.  You may notice your joints feel loose and you may have pain around your pelvic bone.  What to expect at prenatal visits You will have prenatal exams every 2 weeks until week 36. Then you will have weekly prenatal exams. During a routine prenatal visit:  You will be weighed to make sure you and the baby are growing normally.  Your blood pressure will be taken.  Your abdomen will be measured to track your baby's growth.  The fetal heartbeat will be listened to.  Any test results from the previous visit will be discussed.  You may have a cervical check near your due date to see if your cervix has softened or thinned (effaced).  You will be tested for Group B streptococcus. This happens between 35 and 37 weeks.  Your health care provider may ask you:  What your birth plan is.    How you are feeling.  If you are feeling the baby move.  If you have had any abnormal symptoms, such as leaking fluid, bleeding, severe headaches, or abdominal cramping.  If you are using any tobacco products, including cigarettes, chewing tobacco, and electronic cigarettes.  If you have any questions.  Other tests or screenings that may be performed during your third trimester include:  Blood tests that check for low iron levels (anemia).  Fetal testing to check the health, activity level, and growth of the fetus.  Testing is done if you have certain medical conditions or if there are problems during the pregnancy.  Nonstress test (NST). This test checks the health of your baby to make sure there are no signs of problems, such as the baby not getting enough oxygen. During this test, a belt is placed around your belly. The baby is made to move, and its heart rate is monitored during movement.  What is false labor? False labor is a condition in which you feel small, irregular tightenings of the muscles in the womb (contractions) that usually go away with rest, changing position, or drinking water. These are called Braxton Hicks contractions. Contractions may last for hours, days, or even weeks before true labor sets in. If contractions come at regular intervals, become more frequent, increase in intensity, or become painful, you should see your health care provider. What are the signs of labor?  Abdominal cramps.  Regular contractions that start at 10 minutes apart and become stronger and more frequent with time.  Contractions that start on the top of the uterus and spread down to the lower abdomen and back.  Increased pelvic pressure and dull back pain.  A watery or bloody mucus discharge that comes from the vagina.  Leaking of amniotic fluid. This is also known as your "water breaking." It could be a slow trickle or a gush. Let your health care provider know if it has a color or strange odor. If you have any of these signs, call your health care provider right away, even if it is before your due date. Follow these instructions at home: Medicines  Follow your health care provider's instructions regarding medicine use. Specific medicines may be either safe or unsafe to take during pregnancy.  Take a prenatal vitamin that contains at least 600 micrograms (mcg) of folic acid.  If you develop constipation, try taking a stool softener if your health care provider approves. Eating and drinking  Eat a  balanced diet that includes fresh fruits and vegetables, whole grains, good sources of protein such as meat, eggs, or tofu, and low-fat dairy. Your health care provider will help you determine the amount of weight gain that is right for you.  Avoid raw meat and uncooked cheese. These carry germs that can cause birth defects in the baby.  If you have low calcium intake from food, talk to your health care provider about whether you should take a daily calcium supplement.  Eat four or five small meals rather than three large meals a day.  Limit foods that are high in fat and processed sugars, such as fried and sweet foods.  To prevent constipation: ? Drink enough fluid to keep your urine clear or pale yellow. ? Eat foods that are high in fiber, such as fresh fruits and vegetables, whole grains, and beans. Activity  Exercise only as directed by your health care provider. Most women can continue their usual exercise routine during pregnancy. Try to exercise for 30   minutes at least 5 days a week. Stop exercising if you experience uterine contractions.  Avoid heavy lifting.  Do not exercise in extreme heat or humidity, or at high altitudes.  Wear low-heel, comfortable shoes.  Practice good posture.  You may continue to have sex unless your health care provider tells you otherwise. Relieving pain and discomfort  Take frequent breaks and rest with your legs elevated if you have leg cramps or low back pain.  Take warm sitz baths to soothe any pain or discomfort caused by hemorrhoids. Use hemorrhoid cream if your health care provider approves.  Wear a good support bra to prevent discomfort from breast tenderness.  If you develop varicose veins: ? Wear support pantyhose or compression stockings as told by your healthcare provider. ? Elevate your feet for 15 minutes, 3-4 times a day. Prenatal care  Write down your questions. Take them to your prenatal visits.  Keep all your prenatal  visits as told by your health care provider. This is important. Safety  Wear your seat belt at all times when driving.  Make a list of emergency phone numbers, including numbers for family, friends, the hospital, and police and fire departments. General instructions  Avoid cat litter boxes and soil used by cats. These carry germs that can cause birth defects in the baby. If you have a cat, ask someone to clean the litter box for you.  Do not travel far distances unless it is absolutely necessary and only with the approval of your health care provider.  Do not use hot tubs, steam rooms, or saunas.  Do not drink alcohol.  Do not use any products that contain nicotine or tobacco, such as cigarettes and e-cigarettes. If you need help quitting, ask your health care provider.  Do not use any medicinal herbs or unprescribed drugs. These chemicals affect the formation and growth of the baby.  Do not douche or use tampons or scented sanitary pads.  Do not cross your legs for long periods of time.  To prepare for the arrival of your baby: ? Take prenatal classes to understand, practice, and ask questions about labor and delivery. ? Make a trial run to the hospital. ? Visit the hospital and tour the maternity area. ? Arrange for maternity or paternity leave through employers. ? Arrange for family and friends to take care of pets while you are in the hospital. ? Purchase a rear-facing car seat and make sure you know how to install it in your car. ? Pack your hospital bag. ? Prepare the baby's nursery. Make sure to remove all pillows and stuffed animals from the baby's crib to prevent suffocation.  Visit your dentist if you have not gone during your pregnancy. Use a soft toothbrush to brush your teeth and be gentle when you floss. Contact a health care provider if:  You are unsure if you are in labor or if your water has broken.  You become dizzy.  You have mild pelvic cramps, pelvic  pressure, or nagging pain in your abdominal area.  You have lower back pain.  You have persistent nausea, vomiting, or diarrhea.  You have an unusual or bad smelling vaginal discharge.  You have pain when you urinate. Get help right away if:  Your water breaks before 37 weeks.  You have regular contractions less than 5 minutes apart before 37 weeks.  You have a fever.  You are leaking fluid from your vagina.  You have spotting or bleeding from your vagina.    You have severe abdominal pain or cramping.  You have rapid weight loss or weight gain.  You have shortness of breath with chest pain.  You notice sudden or extreme swelling of your face, hands, ankles, feet, or legs.  Your baby makes fewer than 10 movements in 2 hours.  You have severe headaches that do not go away when you take medicine.  You have vision changes. Summary  The third trimester is from week 28 through week 40, months 7 through 9. The third trimester is a time when the unborn baby (fetus) is growing rapidly.  During the third trimester, your discomfort may increase as you and your baby continue to gain weight. You may have abdominal, leg, and back pain, sleeping problems, and an increased need to urinate.  During the third trimester your breasts will keep growing and they will continue to become tender. A yellow fluid (colostrum) may leak from your breasts. This is the first milk you are producing for your baby.  False labor is a condition in which you feel small, irregular tightenings of the muscles in the womb (contractions) that eventually go away. These are called Braxton Hicks contractions. Contractions may last for hours, days, or even weeks before true labor sets in.  Signs of labor can include: abdominal cramps; regular contractions that start at 10 minutes apart and become stronger and more frequent with time; watery or bloody mucus discharge that comes from the vagina; increased pelvic pressure  and dull back pain; and leaking of amniotic fluid. This information is not intended to replace advice given to you by your health care provider. Make sure you discuss any questions you have with your health care provider. Document Released: 08/25/2001 Document Revised: 02/06/2016 Document Reviewed: 11/01/2012 Elsevier Interactive Patient Education  2017 [employer]  

## 2017-10-22 LAB — COMPREHENSIVE METABOLIC PANEL
A/G RATIO: 1.3 (ref 1.2–2.2)
ALT: 11 IU/L (ref 0–32)
AST: 12 IU/L (ref 0–40)
Albumin: 3.6 g/dL (ref 3.5–5.5)
Alkaline Phosphatase: 82 IU/L (ref 39–117)
BUN/Creatinine Ratio: 7 — ABNORMAL LOW (ref 9–23)
BUN: 4 mg/dL — ABNORMAL LOW (ref 6–20)
Bilirubin Total: 0.2 mg/dL (ref 0.0–1.2)
CALCIUM: 9 mg/dL (ref 8.7–10.2)
CO2: 19 mmol/L — AB (ref 20–29)
Chloride: 106 mmol/L (ref 96–106)
Creatinine, Ser: 0.56 mg/dL — ABNORMAL LOW (ref 0.57–1.00)
GFR calc Af Amer: 148 mL/min/{1.73_m2} (ref >59)
GFR calc non Af Amer: 128 mL/min/{1.73_m2} (ref >59)
GLOBULIN, TOTAL: 2.7 g/dL (ref 1.5–4.5)
Glucose: 70 mg/dL (ref 65–99)
POTASSIUM: 4.3 mmol/L (ref 3.5–5.2)
SODIUM: 139 mmol/L (ref 134–144)
Total Protein: 6.3 g/dL (ref 6.0–8.5)

## 2017-10-22 LAB — GLUCOSE TOLERANCE, 2 HOURS W/ 1HR
GLUCOSE, 1 HOUR: 109 mg/dL (ref 65–179)
GLUCOSE, 2 HOUR: 98 mg/dL (ref 65–152)
GLUCOSE, FASTING: 67 mg/dL (ref 65–91)

## 2017-10-22 LAB — CBC
HEMOGLOBIN: 11.9 g/dL (ref 11.1–15.9)
Hematocrit: 37.3 % (ref 34.0–46.6)
MCH: 30.1 pg (ref 26.6–33.0)
MCHC: 31.9 g/dL (ref 31.5–35.7)
MCV: 94 fL (ref 79–97)
Platelets: 229 10*3/uL (ref 150–379)
RBC: 3.95 x10E6/uL (ref 3.77–5.28)
RDW: 13.7 % (ref 12.3–15.4)
WBC: 10.4 10*3/uL (ref 3.4–10.8)

## 2017-10-22 LAB — HIV ANTIBODY (ROUTINE TESTING W REFLEX): HIV SCREEN 4TH GENERATION: NONREACTIVE

## 2017-10-22 LAB — RPR: RPR: NONREACTIVE

## 2017-10-25 ENCOUNTER — Other Ambulatory Visit: Payer: Self-pay | Admitting: Certified Nurse Midwife

## 2017-10-25 ENCOUNTER — Ambulatory Visit (HOSPITAL_COMMUNITY)
Admission: RE | Admit: 2017-10-25 | Discharge: 2017-10-25 | Disposition: A | Discharge status: Home / Self Care | Payer: Medicaid Other | Source: Ambulatory Visit | Attending: Certified Nurse Midwife | Admitting: Certified Nurse Midwife

## 2017-10-25 DIAGNOSIS — O10911 Unspecified pre-existing hypertension complicating pregnancy, first trimester: Secondary | ICD-10-CM

## 2017-10-25 DIAGNOSIS — Z3A28 28 weeks gestation of pregnancy: Secondary | ICD-10-CM

## 2017-10-25 DIAGNOSIS — Z362 Encounter for other antenatal screening follow-up: Secondary | ICD-10-CM | POA: Insufficient documentation

## 2017-10-25 NOTE — Addendum Note (Signed)
Encounter addended by: Hilda Lias, RT on: 10/25/2017 9:44 AM  Actions taken: Imaging Exam ended

## 2017-11-04 ENCOUNTER — Encounter: Payer: Self-pay | Admitting: Obstetrics and Gynecology

## 2017-11-04 ENCOUNTER — Ambulatory Visit (INDEPENDENT_AMBULATORY_CARE_PROVIDER_SITE_OTHER): Payer: Medicaid Other | Admitting: Obstetrics and Gynecology

## 2017-11-04 VITALS — BP 133/75 | HR 92 | Wt 245.2 lb

## 2017-11-04 DIAGNOSIS — O10919 Unspecified pre-existing hypertension complicating pregnancy, unspecified trimester: Secondary | ICD-10-CM

## 2017-11-04 DIAGNOSIS — O0990 Supervision of high risk pregnancy, unspecified, unspecified trimester: Secondary | ICD-10-CM

## 2017-11-04 NOTE — Progress Notes (Signed)
Pt c/opelvic pressure

## 2017-11-04 NOTE — Progress Notes (Signed)
   PRENATAL VISIT NOTE  Subjective:  Amber Dominguez is a 27 y.o. G2P1001 at [redacted]w[redacted]d being seen today for ongoing prenatal care.  She is currently monitored for the following issues for this high-risk pregnancy and has Supervision of high risk pregnancy, antepartum; Chronic hypertension complicating pregnancy, antepartum; Low vitamin D level; and Morbid obesity ([place]) on their problem list.  Patient reports some pelvic pressure with fetal movement.  Contractions: Not present. Vag. Bleeding: None.  Movement: Present. Denies leaking of fluid.   The following portions of the patient's history were reviewed and updated as appropriate: allergies, current medications, past family history, past medical history, past social history, past surgical history and problem list. Problem list updated.  Objective:   Vitals:   11/04/17 0841  BP: 133/75  Pulse: 92  Weight: 245 lb 3.2 oz (111.2 kg)    Fetal Status: Fetal Heart Rate (bpm): 146 Fundal Height: 30 cm Movement: Present     General:  Alert, oriented and cooperative. Patient is in no acute distress.  Skin: Skin is warm and dry. No rash noted.   Cardiovascular: Normal heart rate noted  Respiratory: Normal respiratory effort, no problems with respiration noted  Abdomen: Soft, gravid, appropriate for gestational age.  Pain/Pressure: Present     Pelvic: Cervical exam deferred        Extremities: Normal range of motion.  Edema: None  Mental Status:  Normal mood and affect. Normal behavior. Normal judgment and thought content.   Assessment and Plan:  Pregnancy: G2P1001 at [redacted]w[redacted]d  1. Supervision of high risk pregnancy, antepartum Patient is doing well Reviewed lab results from last week - [place] MFM OB FOLLOW UP; Future - [place] MFM FETAL BPP WO NON STRESS; Future  2. Chronic hypertension complicating pregnancy, antepartum BP stable Follow up growth ultrasound ordered Antenatal testing starting at 32 weeks - [place] [facility] OB FOLLOW UP; Future - [place] [facility]FM  FETAL BPP WO NON STRESS; Future  3. Morbid obesity ([place])   Preterm labor symptoms and general obstetric precautions including but not limited to vaginal bleeding, contractions, leaking of fluid and fetal movement were reviewed in detail with the patient. Please refer to After Visit Summary for other counseling recommendations.  Return in about 2 weeks (around 11/18/2017) for [place].   [name], MD

## 2017-11-18 ENCOUNTER — Ambulatory Visit (INDEPENDENT_AMBULATORY_CARE_PROVIDER_SITE_OTHER): Payer: Medicaid Other | Admitting: Obstetrics and Gynecology

## 2017-11-18 ENCOUNTER — Other Ambulatory Visit (HOSPITAL_COMMUNITY)
Admission: RE | Admit: 2017-11-18 | Discharge: 2017-11-18 | Disposition: A | Discharge status: Home / Self Care | Payer: Medicaid Other | Source: Ambulatory Visit | Attending: Obstetrics and Gynecology | Admitting: Obstetrics and Gynecology

## 2017-11-18 VITALS — BP 117/73 | HR 105 | Wt 247.5 lb

## 2017-11-18 DIAGNOSIS — N939 Abnormal uterine and vaginal bleeding, unspecified: Secondary | ICD-10-CM | POA: Diagnosis not present

## 2017-11-18 DIAGNOSIS — N898 Other specified noninflammatory disorders of vagina: Secondary | ICD-10-CM

## 2017-11-18 DIAGNOSIS — O10919 Unspecified pre-existing hypertension complicating pregnancy, unspecified trimester: Secondary | ICD-10-CM

## 2017-11-18 DIAGNOSIS — O0990 Supervision of high risk pregnancy, unspecified, unspecified trimester: Secondary | ICD-10-CM

## 2017-11-18 NOTE — Patient Instructions (Signed)

## 2017-11-18 NOTE — Progress Notes (Signed)
   PRENATAL VISIT NOTE  Subjective:  Amber Dominguez is a 27 y.o. G2P1001 at [redacted]w[redacted]d being seen today for ongoing prenatal care.  She is currently monitored for the following issues for this high-risk pregnancy and has Supervision of high risk pregnancy, antepartum; Chronic hypertension complicating pregnancy, antepartum; Low vitamin D level; and Morbid obesity ([place]) on their problem list.  Patient reports some spotting.  Contractions: Irritability. Vag. Bleeding: Bloody Show.  Movement: Present. Denies leaking of fluid.   The following portions of the patient's history were reviewed and updated as appropriate: allergies, current medications, past family history, past medical history, past social history, past surgical history and problem list. Problem list updated.  Objective:   Vitals:   11/18/17 0847  BP: 117/73  Pulse: (!) 105  Weight: 247 lb 8 oz (112.3 kg)    Fetal Status: Fetal Heart Rate (bpm): 140   Movement: Present     General:  Alert, oriented and cooperative. Patient is in no acute distress.  Skin: Skin is warm and dry. No rash noted.   Cardiovascular: Normal heart rate noted  Respiratory: Normal respiratory effort, no problems with respiration noted  Abdomen: Soft, gravid, appropriate for gestational age.  Pain/Pressure: Present     Pelvic: Cervical exam deferred       moderate amount yellowish thin discharge in vagina, cervix very friable with bleeding with minimal touching of swab  Extremities: Normal range of motion.  Edema: None  Mental Status:  Normal mood and affect. Normal behavior. Normal judgment and thought content.   Assessment and Plan:  Pregnancy: G2P1001 at [redacted]w[redacted]d  1. Chronic hypertension complicating pregnancy, antepartum Stable Cont baby ASA To start weekly monitoring next week  2. Supervision of high risk pregnancy, antepartum  3. Vaginal spotting - Cervicovaginal ancillary only   Preterm labor symptoms and general obstetric precautions  including but not limited to vaginal bleeding, contractions, leaking of fluid and fetal movement were reviewed in detail with the patient. Please refer to After Visit Summary for other counseling recommendations.  Return in about 2 weeks (around 12/02/2017) for OB visit (MD).   Sloan Leiter, MD

## 2017-11-20 LAB — CERVICOVAGINAL ANCILLARY ONLY
Bacterial vaginitis: NEGATIVE
CANDIDA VAGINITIS: NEGATIVE
CHLAMYDIA, DNA PROBE: NEGATIVE
Neisseria Gonorrhea: NEGATIVE
TRICH (WINDOWPATH): NEGATIVE

## 2017-11-24 ENCOUNTER — Ambulatory Visit (HOSPITAL_COMMUNITY)
Admission: RE | Admit: 2017-11-24 | Discharge: 2017-11-24 | Disposition: A | Discharge status: Home / Self Care | Payer: Medicaid Other | Source: Ambulatory Visit | Attending: Obstetrics and Gynecology | Admitting: Obstetrics and Gynecology

## 2017-11-24 DIAGNOSIS — Z3A32 32 weeks gestation of pregnancy: Secondary | ICD-10-CM | POA: Diagnosis not present

## 2017-11-24 DIAGNOSIS — Z362 Encounter for other antenatal screening follow-up: Secondary | ICD-10-CM | POA: Insufficient documentation

## 2017-11-24 DIAGNOSIS — O359XX0 Maternal care for (suspected) fetal abnormality and damage, unspecified, not applicable or unspecified: Secondary | ICD-10-CM | POA: Diagnosis not present

## 2017-11-24 DIAGNOSIS — O0990 Supervision of high risk pregnancy, unspecified, unspecified trimester: Secondary | ICD-10-CM

## 2017-11-24 DIAGNOSIS — O10913 Unspecified pre-existing hypertension complicating pregnancy, third trimester: Secondary | ICD-10-CM | POA: Diagnosis not present

## 2017-11-24 DIAGNOSIS — O0993 Supervision of high risk pregnancy, unspecified, third trimester: Secondary | ICD-10-CM | POA: Insufficient documentation

## 2017-11-24 DIAGNOSIS — O10919 Unspecified pre-existing hypertension complicating pregnancy, unspecified trimester: Secondary | ICD-10-CM

## 2017-11-24 DIAGNOSIS — N1330 Unspecified hydronephrosis: Secondary | ICD-10-CM | POA: Insufficient documentation

## 2017-11-25 ENCOUNTER — Ambulatory Visit (INDEPENDENT_AMBULATORY_CARE_PROVIDER_SITE_OTHER): Payer: Medicaid Other | Admitting: Obstetrics and Gynecology

## 2017-11-25 ENCOUNTER — Encounter: Payer: Self-pay | Admitting: Obstetrics and Gynecology

## 2017-11-25 VITALS — BP 122/76 | HR 86 | Wt 252.2 lb

## 2017-11-25 DIAGNOSIS — O10919 Unspecified pre-existing hypertension complicating pregnancy, unspecified trimester: Secondary | ICD-10-CM

## 2017-11-25 DIAGNOSIS — O0990 Supervision of high risk pregnancy, unspecified, unspecified trimester: Secondary | ICD-10-CM

## 2017-11-25 DIAGNOSIS — O10913 Unspecified pre-existing hypertension complicating pregnancy, third trimester: Secondary | ICD-10-CM

## 2017-11-25 DIAGNOSIS — O0993 Supervision of high risk pregnancy, unspecified, third trimester: Secondary | ICD-10-CM

## 2017-11-25 NOTE — Patient Instructions (Signed)
Places to have your son circumcised:    [facility] [phone] $[phone] while you are in hospital  [facility] [phone] $244 by 4 wks  [facility] [phone] $[phone] by 2 wks  [facility] [phone] $[phone] by 7 days [facility] [phone] $[phone] by 4 wks  These prices sometimes change but are roughly what you can expect to pay. Please call and confirm pricing.   Circumcision is considered an elective/non-medically necessary procedure. There are many reasons parents decide to have their sons circumsized. During the first year of life circumcised males have a reduced risk of urinary tract infections but after this year the rates between circumcised males and uncircumcised males are the same.  It is safe to have your son circumcised outside of the hospital and the places above perform them regularly.   Deciding about Circumcision in Baby Boys  (Up-to-date The Basics)  What is circumcision?  Circumcision is a surgery that removes the skin that covers the tip of the penis, called the "foreskin" Circumcision is usually done when a boy is between 1 and 10 days old. In the [place], circumcision is common. In some other countries, fewer boys are circumcised. Circumcision is a common tradition in some religions.  Should I have my baby boy circumcised?  There is no easy answer. Circumcision has some benefits. But it also has risks. After talking with your doctor, you will have to decide for yourself what is right for your family.  What are the benefits of circumcision?  Circumcised boys seem to have slightly lower rates of: ?Urinary tract infections ?Swelling of the opening at the tip of the penis Circumcised men seem to have slightly lower rates of: ?Urinary tract infections ?Swelling of the opening at the tip of the penis ?Penis  cancer ?HIV and other infections that you catch during sex ?Cervical cancer in the women they have sex with Even so, in the [place], the risks of these problems are small - even in boys and men who have not been circumcised. Plus, boys and men who are not circumcised can reduce these extra risks by: ?Cleaning their penis well ?Using condoms during sex  What are the risks of circumcision?  Risks include: ?Bleeding or infection from the surgery ?Damage to or amputation of the penis ?A chance that the doctor will cut off too much or not enough of the foreskin ?A chance that sex won't feel as good later in life Only about 1 out of every 200 circumcisions leads to problems. There is also a chance that your health insurance won't pay for circumcision.  How is circumcision done in baby boys?  First, the baby gets medicine for pain relief. This might be a cream on the skin or a shot into the base of the penis. Next, the doctor cleans the baby's penis well. Then he or she uses special tools to cut off the foreskin. Finally, the doctor wraps a bandage (called gauze) around the baby's penis. If you have your baby circumcised, his doctor or nurse will give you instructions on how to care for him after the surgery. It is important that you follow those instructions carefully.  

## 2017-11-25 NOTE — Progress Notes (Signed)
   PRENATAL VISIT NOTE  Subjective:  Amber Dominguez is a 27 y.o. G2P1001 at [redacted]w[redacted]d being seen today for ongoing prenatal care.  She is currently monitored for the following issues for this high-risk pregnancy and has Supervision of high risk pregnancy, antepartum; Chronic hypertension complicating pregnancy, antepartum; Low vitamin D level; and Morbid obesity ([place]) on their problem list.  Patient reports no complaints.  Contractions: Irregular. Vag. Bleeding: None.  Movement: Present. Denies leaking of fluid.   The following portions of the patient's history were reviewed and updated as appropriate: allergies, current medications, past family history, past medical history, past social history, past surgical history and problem list. Problem list updated.  Objective:   Vitals:   11/25/17 1033  BP: 122/76  Pulse: 86  Weight: 252 lb 3.2 oz (114.4 kg)    Fetal Status: Fetal Heart Rate (bpm): NST Fundal Height: 33 cm Movement: Present     General:  Alert, oriented and cooperative. Patient is in no acute distress.  Skin: Skin is warm and dry. No rash noted.   Cardiovascular: Normal heart rate noted  Respiratory: Normal respiratory effort, no problems with respiration noted  Abdomen: Soft, gravid, appropriate for gestational age.  Pain/Pressure: Present     Pelvic: Cervical exam deferred        Extremities: Normal range of motion.  Edema: None  Mental Status:  Normal mood and affect. Normal behavior. Normal judgment and thought content.   Assessment and Plan:  Pregnancy: G2P1001 at [redacted]w[redacted]d  1. Supervision of high risk pregnancy, antepartum Patient is doing well without complaints  2. Chronic hypertension complicating pregnancy, antepartum BP stable without medication Continue ASA Follow up growth in April Normal growth yesterday with dilated fetal ureter- reviewed these results with the patient NST reviewed and reactive with baseline 140, mod variability, +accels, no  decels Continue antenatal testing - Fetal nonstress test - [place] MFM OB FOLLOW UP; Future  Preterm labor symptoms and general obstetric precautions including but not limited to vaginal bleeding, contractions, leaking of fluid and fetal movement were reviewed in detail with the patient. Please refer to After Visit Summary for other counseling recommendations.  Return in about 1 week (around 12/02/2017) for ROB, NST, AFI  (as scheduled).   [name], MD

## 2017-11-29 ENCOUNTER — Ambulatory Visit (INDEPENDENT_AMBULATORY_CARE_PROVIDER_SITE_OTHER): Payer: Medicaid Other

## 2017-11-29 VITALS — BP 115/75 | HR 81 | Wt 250.4 lb

## 2017-11-29 DIAGNOSIS — O10913 Unspecified pre-existing hypertension complicating pregnancy, third trimester: Secondary | ICD-10-CM

## 2017-11-29 DIAGNOSIS — O10919 Unspecified pre-existing hypertension complicating pregnancy, unspecified trimester: Secondary | ICD-10-CM

## 2017-11-29 DIAGNOSIS — O0990 Supervision of high risk pregnancy, unspecified, unspecified trimester: Secondary | ICD-10-CM

## 2017-11-29 NOTE — Progress Notes (Signed)
Presents for NST.  [facility] in [place].

## 2017-12-02 ENCOUNTER — Other Ambulatory Visit: Payer: Medicaid Other

## 2017-12-02 ENCOUNTER — Encounter: Payer: Self-pay | Admitting: Obstetrics & Gynecology

## 2017-12-02 ENCOUNTER — Ambulatory Visit (INDEPENDENT_AMBULATORY_CARE_PROVIDER_SITE_OTHER): Payer: Medicaid Other | Admitting: Obstetrics & Gynecology

## 2017-12-02 VITALS — BP 125/78 | HR 96 | Wt 251.8 lb

## 2017-12-02 DIAGNOSIS — O0990 Supervision of high risk pregnancy, unspecified, unspecified trimester: Secondary | ICD-10-CM

## 2017-12-02 DIAGNOSIS — O0993 Supervision of high risk pregnancy, unspecified, third trimester: Secondary | ICD-10-CM | POA: Diagnosis not present

## 2017-12-02 DIAGNOSIS — O10913 Unspecified pre-existing hypertension complicating pregnancy, third trimester: Secondary | ICD-10-CM

## 2017-12-02 DIAGNOSIS — O10919 Unspecified pre-existing hypertension complicating pregnancy, unspecified trimester: Secondary | ICD-10-CM

## 2017-12-02 NOTE — Patient Instructions (Signed)

## 2017-12-02 NOTE — Progress Notes (Unsigned)
Exam performed by Lewie Loron, CMA.

## 2017-12-02 NOTE — Progress Notes (Signed)
   PRENATAL VISIT NOTE  Subjective:  Amber Dominguez is a 27 y.o. G2P1001 at [redacted]w[redacted]d being seen today for ongoing prenatal care.  She is currently monitored for the following issues for this high-risk pregnancy and has Supervision of high risk pregnancy, antepartum; Chronic hypertension complicating pregnancy, antepartum; Low vitamin D level; and Morbid obesity ([place]) on their problem list.  Patient reports no complaints.  Contractions: Not present. Vag. Bleeding: None.  Movement: Present. Denies leaking of fluid.   The following portions of the patient's history were reviewed and updated as appropriate: allergies, current medications, past family history, past medical history, past social history, past surgical history and problem list. Problem list updated.  Objective:   Vitals:   12/02/17 0954  BP: 125/78  Pulse: 96  Weight: 251 lb 12.8 oz (114.2 kg)    Fetal Status: Fetal Heart Rate (bpm): NST/AFI   Movement: Present     General:  Alert, oriented and cooperative. Patient is in no acute distress.  Skin: Skin is warm and dry. No rash noted.   Cardiovascular: Normal heart rate noted  Respiratory: Normal respiratory effort, no problems with respiration noted  Abdomen: Soft, gravid, appropriate for gestational age.  Pain/Pressure: Present     Pelvic: Cervical exam deferred        Extremities: Normal range of motion.  Edema: None  Mental Status:  Normal mood and affect. Normal behavior. Normal judgment and thought content.   Assessment and Plan:  Pregnancy: G2P1001 at [redacted]w[redacted]d  1. Supervision of high risk pregnancy, antepartum - Fetal nonstress test - US OB Limited AFI and NST normal 2. Chronic hypertension complicating pregnancy, antepartum Normal BP  Preterm labor symptoms and general obstetric precautions including but not limited to vaginal bleeding, contractions, leaking of fluid and fetal movement were reviewed in detail with the patient. Please refer to After Visit Summary  for other counseling recommendations.  Return in about 1 week (around 12/09/2017) for NST bpp.   Emeterio Reeve, MD

## 2017-12-06 ENCOUNTER — Ambulatory Visit (INDEPENDENT_AMBULATORY_CARE_PROVIDER_SITE_OTHER): Payer: Medicaid Other | Admitting: Obstetrics and Gynecology

## 2017-12-06 VITALS — BP 125/78 | HR 82 | Temp 97.9°F | Wt 250.5 lb

## 2017-12-06 DIAGNOSIS — O10913 Unspecified pre-existing hypertension complicating pregnancy, third trimester: Secondary | ICD-10-CM

## 2017-12-06 DIAGNOSIS — O10919 Unspecified pre-existing hypertension complicating pregnancy, unspecified trimester: Secondary | ICD-10-CM

## 2017-12-06 NOTE — Progress Notes (Signed)
Patient with [facility] here for NST NST reviewed and reactive with baseline 140, mod variability, +accels, no decels

## 2017-12-09 ENCOUNTER — Ambulatory Visit: Payer: Medicaid Other

## 2017-12-09 ENCOUNTER — Ambulatory Visit (INDEPENDENT_AMBULATORY_CARE_PROVIDER_SITE_OTHER): Payer: Medicaid Other | Admitting: Obstetrics and Gynecology

## 2017-12-09 ENCOUNTER — Other Ambulatory Visit: Payer: Medicaid Other

## 2017-12-09 ENCOUNTER — Encounter: Payer: Self-pay | Admitting: Obstetrics and Gynecology

## 2017-12-09 VITALS — BP 129/80 | HR 92 | Wt 247.2 lb

## 2017-12-09 DIAGNOSIS — O10913 Unspecified pre-existing hypertension complicating pregnancy, third trimester: Secondary | ICD-10-CM | POA: Diagnosis not present

## 2017-12-09 DIAGNOSIS — O0990 Supervision of high risk pregnancy, unspecified, unspecified trimester: Secondary | ICD-10-CM

## 2017-12-09 DIAGNOSIS — O10919 Unspecified pre-existing hypertension complicating pregnancy, unspecified trimester: Secondary | ICD-10-CM

## 2017-12-09 DIAGNOSIS — O0993 Supervision of high risk pregnancy, unspecified, third trimester: Secondary | ICD-10-CM

## 2017-12-09 NOTE — Progress Notes (Signed)
   PRENATAL VISIT NOTE  Subjective:  Amber Dominguez is a 27 y.o. G2P1001 at [redacted]w[redacted]d being seen today for ongoing prenatal care.  She is currently monitored for the following issues for this high-risk pregnancy and has Supervision of high risk pregnancy, antepartum; Chronic hypertension complicating pregnancy, antepartum; Low vitamin D level; and Morbid obesity ([place]) on their problem list.  Patient reports no complaints.  Contractions: Irregular. Vag. Bleeding: None.  Movement: Present. Denies leaking of fluid.   The following portions of the patient's history were reviewed and updated as appropriate: allergies, current medications, past family history, past medical history, past social history, past surgical history and problem list. Problem list updated.  Objective:   Vitals:   12/09/17 1015  BP: 129/80  Pulse: 92  Weight: 247 lb 3.2 oz (112.1 kg)    Fetal Status: Fetal Heart Rate (bpm): NST/AFI   Movement: Present     General:  Alert, oriented and cooperative. Patient is in no acute distress.  Skin: Skin is warm and dry. No rash noted.   Cardiovascular: Normal heart rate noted  Respiratory: Normal respiratory effort, no problems with respiration noted  Abdomen: Soft, gravid, appropriate for gestational age.  Pain/Pressure: Present     Pelvic: Cervical exam deferred        Extremities: Normal range of motion.  Edema: Trace  Mental Status:  Normal mood and affect. Normal behavior. Normal judgment and thought content.   Assessment and Plan:  Pregnancy: G2P1001 at [redacted]w[redacted]d  1. Chronic hypertension complicating pregnancy, antepartum Normotensive without meds Continue ASA - Fetal nonstress test- reviewed and reactive with baseline 140, mod variability, +accels, no decels Continue antenatal testing Growth ultrasound 4/8 - US OB Limited  2. Supervision of high risk pregnancy, antepartum Patient is doing well without complaints  3. Morbid obesity ([place])   Preterm labor symptoms  and general obstetric precautions including but not limited to vaginal bleeding, contractions, leaking of fluid and fetal movement were reviewed in detail with the patient. Please refer to After Visit Summary for other counseling recommendations.  Return in about 1 week (around 12/16/2017) for ROB, NST, AFI.   [name], MD]

## 2017-12-09 NOTE — Addendum Note (Signed)
Addended by: Lucianne Lei on: 12/09/2017 11:54 AM   Modules accepted: Orders

## 2017-12-10 ENCOUNTER — Ambulatory Visit: Payer: Medicaid Other

## 2017-12-13 ENCOUNTER — Ambulatory Visit (INDEPENDENT_AMBULATORY_CARE_PROVIDER_SITE_OTHER): Payer: Medicaid Other | Admitting: *Deleted

## 2017-12-13 VITALS — BP 137/81 | HR 89 | Wt 250.0 lb

## 2017-12-13 DIAGNOSIS — O10913 Unspecified pre-existing hypertension complicating pregnancy, third trimester: Secondary | ICD-10-CM

## 2017-12-13 DIAGNOSIS — O0990 Supervision of high risk pregnancy, unspecified, unspecified trimester: Secondary | ICD-10-CM

## 2017-12-13 DIAGNOSIS — O10919 Unspecified pre-existing hypertension complicating pregnancy, unspecified trimester: Secondary | ICD-10-CM

## 2017-12-13 NOTE — Progress Notes (Signed)
Pt is in office today for NST- CHTN in pregnancy. NST was reviewed with Dr Elly Modena, noted Reactive.  Pt removed from monitor and has next appt scheduled. Pt had no other concerns today.  BP 137/81   Pulse 89   Wt 250 lb (113.4 kg)   LMP 04/07/2017 (Exact Date)   BMI 41.60 kg/m

## 2017-12-13 NOTE — Progress Notes (Addendum)
NST reviewed and reactive with baseline 130, mod variability, +accels, no decels

## 2017-12-16 ENCOUNTER — Ambulatory Visit (INDEPENDENT_AMBULATORY_CARE_PROVIDER_SITE_OTHER): Payer: Medicaid Other | Admitting: Obstetrics and Gynecology

## 2017-12-16 ENCOUNTER — Ambulatory Visit: Payer: Medicaid Other

## 2017-12-16 ENCOUNTER — Encounter: Payer: Self-pay | Admitting: Obstetrics and Gynecology

## 2017-12-16 VITALS — BP 132/84 | HR 94 | Wt 251.0 lb

## 2017-12-16 DIAGNOSIS — O0990 Supervision of high risk pregnancy, unspecified, unspecified trimester: Secondary | ICD-10-CM

## 2017-12-16 DIAGNOSIS — O10919 Unspecified pre-existing hypertension complicating pregnancy, unspecified trimester: Secondary | ICD-10-CM | POA: Diagnosis not present

## 2017-12-16 DIAGNOSIS — O0993 Supervision of high risk pregnancy, unspecified, third trimester: Secondary | ICD-10-CM | POA: Diagnosis not present

## 2017-12-16 DIAGNOSIS — O10913 Unspecified pre-existing hypertension complicating pregnancy, third trimester: Secondary | ICD-10-CM

## 2017-12-16 NOTE — Progress Notes (Signed)
   PRENATAL VISIT NOTE  Subjective:  Amber Dominguez is a 27 y.o. G2P1001 at [redacted]w[redacted]d being seen today for ongoing prenatal care.  She is currently monitored for the following issues for this high-risk pregnancy and has Supervision of high risk pregnancy, antepartum; Chronic hypertension complicating pregnancy, antepartum; Low vitamin D level; and Morbid obesity ([place]) on their problem list.  Patient reports no complaints.  Contractions: Not present. Vag. Bleeding: None.  Movement: Present. Denies leaking of fluid.   The following portions of the patient's history were reviewed and updated as appropriate: allergies, current medications, past family history, past medical history, past social history, past surgical history and problem list. Problem list updated.  Objective:   Vitals:   12/16/17 1015  BP: 132/84  Pulse: 94  Weight: 251 lb (113.9 kg)    Fetal Status: Fetal Heart Rate (bpm): NST Fundal Height: 36 cm Movement: Present     General:  Alert, oriented and cooperative. Patient is in no acute distress.  Skin: Skin is warm and dry. No rash noted.   Cardiovascular: Normal heart rate noted  Respiratory: Normal respiratory effort, no problems with respiration noted  Abdomen: Soft, gravid, appropriate for gestational age.  Pain/Pressure: Present     Pelvic: Cervical exam deferred        Extremities: Normal range of motion.     Mental Status: Normal mood and affect. Normal behavior. Normal judgment and thought content.   Assessment and Plan:  Pregnancy: G2P1001 at [redacted]w[redacted]d  1. Supervision of high risk pregnancy, antepartum Patient is doing well without complaints Cultures next visit  2. Chronic hypertension complicating pregnancy, antepartum Normotensive - no meds Continue ASA until 37 weeks Growth ultrasound 4/8 Continue antenatal testing NST reviewed and reactive with baseline 140, mod variability, +accels, no decels  Preterm labor symptoms and general obstetric precautions  including but not limited to vaginal bleeding, contractions, leaking of fluid and fetal movement were reviewed in detail with the patient. Please refer to After Visit Summary for other counseling recommendations.  No follow-ups on file.  Future Appointments  Date Time Provider [place]  12/16/2017 10:45 AM Rainn Zupko, Vickii Chafe, MD [facility] None  12/20/2017 10:00 AM [facility] NURSE [facility] None  12/20/2017 11:15 AM [facility] [place] 4 [facility]  12/23/2017 10:00 AM [facility] ULTRASOUND [facility] None  12/23/2017 10:45 AM Jatavian Calica, MD [facility] None  12/27/2017 10:00 AM [facility] NURSE [facility] None  12/30/2017 10:00 AM [facility] ULTRASOUND [facility] None  12/30/2017 10:45 AM Sloan Leiter, MD [facility] None  01/03/2018 10:00 AM [place] None  01/06/2018 10:00 AM [facility] ULTRASOUND [facility] None  01/06/2018 10:45 AM Sloan Leiter, MD [facility] None  01/10/2018 10:00 AM [facility] NURSE [facility] None    [name], MD

## 2017-12-20 ENCOUNTER — Other Ambulatory Visit: Payer: Medicaid Other

## 2017-12-20 ENCOUNTER — Other Ambulatory Visit: Payer: Self-pay | Admitting: Obstetrics and Gynecology

## 2017-12-20 ENCOUNTER — Ambulatory Visit (INDEPENDENT_AMBULATORY_CARE_PROVIDER_SITE_OTHER): Payer: Medicaid Other

## 2017-12-20 ENCOUNTER — Ambulatory Visit (HOSPITAL_COMMUNITY)
Admission: RE | Admit: 2017-12-20 | Discharge: 2017-12-20 | Disposition: A | Discharge status: Home / Self Care | Payer: Medicaid Other | Source: Ambulatory Visit | Attending: Obstetrics and Gynecology | Admitting: Obstetrics and Gynecology

## 2017-12-20 DIAGNOSIS — O99213 Obesity complicating pregnancy, third trimester: Secondary | ICD-10-CM | POA: Insufficient documentation

## 2017-12-20 DIAGNOSIS — O10913 Unspecified pre-existing hypertension complicating pregnancy, third trimester: Secondary | ICD-10-CM

## 2017-12-20 DIAGNOSIS — O10013 Pre-existing essential hypertension complicating pregnancy, third trimester: Secondary | ICD-10-CM | POA: Insufficient documentation

## 2017-12-20 DIAGNOSIS — Z3A36 36 weeks gestation of pregnancy: Secondary | ICD-10-CM

## 2017-12-20 DIAGNOSIS — O283 Abnormal ultrasonic finding on antenatal screening of mother: Secondary | ICD-10-CM | POA: Insufficient documentation

## 2017-12-20 DIAGNOSIS — O10919 Unspecified pre-existing hypertension complicating pregnancy, unspecified trimester: Secondary | ICD-10-CM

## 2017-12-20 NOTE — Progress Notes (Signed)
Patient with [facility] here for NST NST reviewed and reactive with baseline 135, mod variability, +accels, no decels

## 2017-12-20 NOTE — Progress Notes (Signed)
Pt presents for NST only today.   B/P:131/82 P:66 Wt: 253 lb

## 2017-12-23 ENCOUNTER — Ambulatory Visit (INDEPENDENT_AMBULATORY_CARE_PROVIDER_SITE_OTHER): Payer: Medicaid Other | Admitting: Obstetrics and Gynecology

## 2017-12-23 ENCOUNTER — Other Ambulatory Visit (HOSPITAL_COMMUNITY)
Admission: RE | Admit: 2017-12-23 | Discharge: 2017-12-23 | Disposition: A | Discharge status: Home / Self Care | Payer: Medicaid Other | Source: Ambulatory Visit | Attending: Obstetrics and Gynecology | Admitting: Obstetrics and Gynecology

## 2017-12-23 ENCOUNTER — Ambulatory Visit: Payer: Medicaid Other

## 2017-12-23 ENCOUNTER — Encounter: Payer: Self-pay | Admitting: Obstetrics and Gynecology

## 2017-12-23 VITALS — BP 128/78 | HR 85 | Wt 252.0 lb

## 2017-12-23 DIAGNOSIS — O10913 Unspecified pre-existing hypertension complicating pregnancy, third trimester: Secondary | ICD-10-CM | POA: Diagnosis not present

## 2017-12-23 DIAGNOSIS — O0990 Supervision of high risk pregnancy, unspecified, unspecified trimester: Secondary | ICD-10-CM | POA: Insufficient documentation

## 2017-12-23 DIAGNOSIS — O0993 Supervision of high risk pregnancy, unspecified, third trimester: Secondary | ICD-10-CM

## 2017-12-23 DIAGNOSIS — O99213 Obesity complicating pregnancy, third trimester: Secondary | ICD-10-CM

## 2017-12-23 DIAGNOSIS — O10919 Unspecified pre-existing hypertension complicating pregnancy, unspecified trimester: Secondary | ICD-10-CM

## 2017-12-23 LAB — OB RESULTS CONSOLE GBS: STREP GROUP B AG: NEGATIVE

## 2017-12-23 LAB — OB RESULTS CONSOLE GC/CHLAMYDIA: Gonorrhea: NEGATIVE

## 2017-12-23 NOTE — Progress Notes (Signed)
Pt c/o swelling

## 2017-12-23 NOTE — Progress Notes (Signed)
   PRENATAL VISIT NOTE  Subjective:  Amber Dominguez is a 27 y.o. G2P1001 at [redacted]w[redacted]d being seen today for ongoing prenatal care.  She is currently monitored for the following issues for this high-risk pregnancy and has Supervision of high risk pregnancy, antepartum; Chronic hypertension complicating pregnancy, antepartum; Low vitamin D level; and Morbid obesity ([place]) on their problem list.  Patient reports no complaints.  Contractions: Irregular. Vag. Bleeding: None.  Movement: Present. Denies leaking of fluid.   The following portions of the patient's history were reviewed and updated as appropriate: allergies, current medications, past family history, past medical history, past social history, past surgical history and problem list. Problem list updated.  Objective:   Vitals:   12/23/17 1005  BP: 128/78  Pulse: 85  Weight: 252 lb (114.3 kg)    Fetal Status: Fetal Heart Rate (bpm): NST   Movement: Present  Presentation: Vertex  General:  Alert, oriented and cooperative. Patient is in no acute distress.  Skin: Skin is warm and dry. No rash noted.   Cardiovascular: Normal heart rate noted  Respiratory: Normal respiratory effort, no problems with respiration noted  Abdomen: Soft, gravid, appropriate for gestational age.  Pain/Pressure: Present     Pelvic: Cervical exam performed Dilation: Closed Effacement (%): Thick Station: -3  Extremities: Normal range of motion.  Edema: Trace  Mental Status: Normal mood and affect. Normal behavior. Normal judgment and thought content.   Assessment and Plan:  Pregnancy: G2P1001 at [redacted]w[redacted]d  1. Supervision of high risk pregnancy, antepartum Patient is doing well without complaints Cultures collected - Culture, beta strep (group b only) - GC/Chlamydia probe amp ([place])not at [facility]  2. Chronic hypertension complicating pregnancy, antepartum Stable without medication ASA can be discontinued tomorrow Continue antenatal testing - Fetal  nonstress test- reviewed and reactive with baseline 140, mod variability, +accels, no deces - US OB Limited- Pt informed that the ultrasound is considered a limited OB ultrasound and is not intended to be a complete ultrasound exam.  Patient also informed that the ultrasound is not being completed with the intent of assessing for fetal or placental anomalies or any pelvic abnormalities.  Explained that the purpose of today's ultrasound is to assess for  presentation and AFI.  Patient acknowledges the purpose of the exam and the limitations of the study.   Normal AFI (12.3)  today with viable fetus in cephalic position  IOL scheduled at 40 weeks ([facility] no meds)  3. Morbid obesity ([place])   Preterm labor symptoms and general obstetric precautions including but not limited to vaginal bleeding, contractions, leaking of fluid and fetal movement were reviewed in detail with the patient. Please refer to After Visit Summary for other counseling recommendations.  No follow-ups on file.  Future Appointments  Date Time Provider [place]  12/27/2017 10:00 AM [place] None  12/30/2017 10:00 AM [facility]TRAS[facility] None  12/30/2017 10:45 AM Sloan Leiter, MD [facility] None  01/03/2018 10:00 AM [place] None  01/06/2018 10:00 AM [facility] ULTRASOUND [facility] None  01/06/2018 10:45 AM Sloan Leiter, MD [facility] None  01/10/2018 10:00 AM [facility] NURSE [facility] None    [name], MD

## 2017-12-24 LAB — GC/CHLAMYDIA PROBE AMP (~~LOC~~) NOT AT ARMC
CHLAMYDIA, DNA PROBE: NEGATIVE
Neisseria Gonorrhea: NEGATIVE

## 2017-12-27 ENCOUNTER — Other Ambulatory Visit: Payer: Medicaid Other

## 2017-12-27 ENCOUNTER — Ambulatory Visit (INDEPENDENT_AMBULATORY_CARE_PROVIDER_SITE_OTHER): Payer: Medicaid Other

## 2017-12-27 VITALS — BP 119/70 | HR 84 | Wt 253.4 lb

## 2017-12-27 DIAGNOSIS — O10919 Unspecified pre-existing hypertension complicating pregnancy, unspecified trimester: Secondary | ICD-10-CM

## 2017-12-27 DIAGNOSIS — O10913 Unspecified pre-existing hypertension complicating pregnancy, third trimester: Secondary | ICD-10-CM | POA: Diagnosis not present

## 2017-12-27 LAB — CULTURE, BETA STREP (GROUP B ONLY): Strep Gp B Culture: NEGATIVE

## 2017-12-27 NOTE — Progress Notes (Signed)
Patient with [facility] here for NST NST reviewed and reactive with baseline 135, mod variability, +accels, no decels

## 2017-12-27 NOTE — Progress Notes (Signed)
Pt is in the office for NST only, denies pain, reports good fetal movement.

## 2017-12-30 ENCOUNTER — Other Ambulatory Visit: Payer: Medicaid Other

## 2017-12-30 ENCOUNTER — Encounter: Payer: Self-pay | Admitting: Obstetrics and Gynecology

## 2017-12-30 ENCOUNTER — Ambulatory Visit (INDEPENDENT_AMBULATORY_CARE_PROVIDER_SITE_OTHER): Payer: Medicaid Other | Admitting: Obstetrics and Gynecology

## 2017-12-30 VITALS — BP 119/82 | HR 82 | Wt 257.6 lb

## 2017-12-30 DIAGNOSIS — O0993 Supervision of high risk pregnancy, unspecified, third trimester: Secondary | ICD-10-CM

## 2017-12-30 DIAGNOSIS — O10919 Unspecified pre-existing hypertension complicating pregnancy, unspecified trimester: Secondary | ICD-10-CM

## 2017-12-30 DIAGNOSIS — O10913 Unspecified pre-existing hypertension complicating pregnancy, third trimester: Secondary | ICD-10-CM

## 2017-12-30 DIAGNOSIS — O0990 Supervision of high risk pregnancy, unspecified, unspecified trimester: Secondary | ICD-10-CM

## 2017-12-30 NOTE — Progress Notes (Signed)
   PRENATAL VISIT NOTE  Subjective:  Amber Dominguez is a 27 y.o. G2P1001 at [redacted]w[redacted]d being seen today for ongoing prenatal care.  She is currently monitored for the following issues for this high-risk pregnancy and has Supervision of high risk pregnancy, antepartum; Chronic hypertension complicating pregnancy, antepartum; Low vitamin D level; and Morbid obesity ([place]) on their problem list.  Patient reports pressure in vagina.  Contractions: Irritability. Vag. Bleeding: None.  Movement: Present. Denies leaking of fluid.   The following portions of the patient's history were reviewed and updated as appropriate: allergies, current medications, past family history, past medical history, past social history, past surgical history and problem list. Problem list updated.  Objective:   Vitals:   12/30/17 0959  BP: 119/82  Pulse: 82  Weight: 257 lb 9.6 oz (116.8 kg)    Fetal Status: Fetal Heart Rate (bpm): NST   Movement: Present     General:  Alert, oriented and cooperative. Patient is in no acute distress.  Skin: Skin is warm and dry. No rash noted.   Cardiovascular: Normal heart rate noted  Respiratory: Normal respiratory effort, no problems with respiration noted  Abdomen: Soft, gravid, appropriate for gestational age.  Pain/Pressure: Present     Pelvic: Cervical exam deferred        Extremities: Normal range of motion.  Edema: Trace  Mental Status: Normal mood and affect. Normal behavior. Normal judgment and thought content.   Assessment and Plan:  Pregnancy: G2P1001 at [redacted]w[redacted]d  1. Chronic hypertension complicating pregnancy, antepartum BP stable with no meds NST today reactive AFI 18.03 cm  2. Supervision of high risk pregnancy, antepartum   Term labor symptoms and general obstetric precautions including but not limited to vaginal bleeding, contractions, leaking of fluid and fetal movement were reviewed in detail with the patient. Please refer to After Visit Summary for other  counseling recommendations.  Return in about 1 week (around 01/06/2018) for OB visit (MD).  Future Appointments  Date Time Provider [place]  01/03/2018 10:00 AM [place] None  01/06/2018 10:00 AM [facility]TR[facility] None  01/06/2018 10:45 AM Sloan Leiter, MD [facility] None  01/10/2018 10:00 AM [place] None    Sloan Leiter, MD

## 2018-01-03 ENCOUNTER — Other Ambulatory Visit: Payer: Medicaid Other

## 2018-01-03 ENCOUNTER — Encounter (HOSPITAL_COMMUNITY): Payer: Self-pay | Admitting: *Deleted

## 2018-01-03 ENCOUNTER — Telehealth (HOSPITAL_COMMUNITY): Payer: Self-pay | Admitting: *Deleted

## 2018-01-03 ENCOUNTER — Ambulatory Visit (INDEPENDENT_AMBULATORY_CARE_PROVIDER_SITE_OTHER): Payer: Medicaid Other

## 2018-01-03 VITALS — BP 119/78 | HR 86 | Wt 257.2 lb

## 2018-01-03 DIAGNOSIS — O10913 Unspecified pre-existing hypertension complicating pregnancy, third trimester: Secondary | ICD-10-CM | POA: Diagnosis not present

## 2018-01-03 DIAGNOSIS — O10919 Unspecified pre-existing hypertension complicating pregnancy, unspecified trimester: Secondary | ICD-10-CM

## 2018-01-03 NOTE — Progress Notes (Signed)
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

## 2018-01-03 NOTE — Progress Notes (Signed)
NST reviewed and reactive with baseline 130, mod variability, +accels, no decels

## 2018-01-03 NOTE — Telephone Encounter (Signed)
Preadmission screen  

## 2018-01-04 ENCOUNTER — Inpatient Hospital Stay (HOSPITAL_COMMUNITY)
Admission: AD | Admit: 2018-01-04 | Discharge: 2018-01-05 | DRG: 806 | Disposition: A | Discharge status: Home / Self Care | Payer: Medicaid Other | Source: Ambulatory Visit | Attending: Obstetrics & Gynecology | Admitting: Obstetrics & Gynecology

## 2018-01-04 ENCOUNTER — Inpatient Hospital Stay (HOSPITAL_COMMUNITY): Payer: Medicaid Other | Admitting: Anesthesiology

## 2018-01-04 ENCOUNTER — Other Ambulatory Visit: Payer: Self-pay

## 2018-01-04 ENCOUNTER — Encounter (HOSPITAL_COMMUNITY): Payer: Self-pay | Admitting: *Deleted

## 2018-01-04 DIAGNOSIS — Z3A38 38 weeks gestation of pregnancy: Secondary | ICD-10-CM

## 2018-01-04 DIAGNOSIS — O99214 Obesity complicating childbirth: Secondary | ICD-10-CM | POA: Diagnosis present

## 2018-01-04 DIAGNOSIS — O4292 Full-term premature rupture of membranes, unspecified as to length of time between rupture and onset of labor: Secondary | ICD-10-CM | POA: Diagnosis present

## 2018-01-04 DIAGNOSIS — O1002 Pre-existing essential hypertension complicating childbirth: Secondary | ICD-10-CM | POA: Diagnosis present

## 2018-01-04 DIAGNOSIS — K429 Umbilical hernia without obstruction or gangrene: Secondary | ICD-10-CM | POA: Diagnosis present

## 2018-01-04 DIAGNOSIS — R011 Cardiac murmur, unspecified: Secondary | ICD-10-CM | POA: Diagnosis present

## 2018-01-04 LAB — COMPREHENSIVE METABOLIC PANEL
ALT: 13 U/L — ABNORMAL LOW (ref 14–54)
AST: 22 U/L (ref 15–41)
Albumin: 2.9 g/dL — ABNORMAL LOW (ref 3.5–5.0)
Alkaline Phosphatase: 145 U/L — ABNORMAL HIGH (ref 38–126)
Anion gap: 11 (ref 5–15)
BUN: 8 mg/dL (ref 6–20)
CHLORIDE: 109 mmol/L (ref 101–111)
CO2: 17 mmol/L — ABNORMAL LOW (ref 22–32)
CREATININE: 0.54 mg/dL (ref 0.44–1.00)
Calcium: 8.9 mg/dL (ref 8.9–10.3)
Glucose, Bld: 89 mg/dL (ref 65–99)
POTASSIUM: 3.6 mmol/L (ref 3.5–5.1)
Sodium: 137 mmol/L (ref 135–145)
Total Bilirubin: 0.3 mg/dL (ref 0.3–1.2)
Total Protein: 6.5 g/dL (ref 6.5–8.1)

## 2018-01-04 LAB — CBC
HCT: 39.9 % (ref 36.0–46.0)
Hemoglobin: 13.6 g/dL (ref 12.0–15.0)
MCH: 31 pg (ref 26.0–34.0)
MCHC: 34.1 g/dL (ref 30.0–36.0)
MCV: 90.9 fL (ref 78.0–100.0)
PLATELETS: 213 10*3/uL (ref 150–400)
RBC: 4.39 MIL/uL (ref 3.87–5.11)
RDW: 14.4 % (ref 11.5–15.5)
WBC: 12.1 10*3/uL — AB (ref 4.0–10.5)

## 2018-01-04 LAB — TYPE AND SCREEN
ABO/RH(D): O POS
ANTIBODY SCREEN: NEGATIVE

## 2018-01-04 LAB — POCT FERN TEST
POCT FERN TEST: POSITIVE
POCT Fern Test: POSITIVE

## 2018-01-04 MED ORDER — ACETAMINOPHEN 325 MG PO TABS
650.0000 mg | ORAL_TABLET | ORAL | Status: DC | PRN
  Administered 2018-01-04 – 2018-01-05 (×2): 650 mg via ORAL
  Filled 2018-01-04 (×2): qty 2

## 2018-01-04 MED ORDER — ACETAMINOPHEN 325 MG PO TABS: 650.0000 mg | ORAL_TABLET | ORAL | Status: DC | PRN

## 2018-01-04 MED ORDER — FENTANYL 2.5 MCG/ML BUPIVACAINE 1/10 % EPIDURAL INFUSION (WH - ANES)
14.0000 mL/h | INTRAMUSCULAR | Status: DC | PRN
  Administered 2018-01-04: 12 mL/h via EPIDURAL

## 2018-01-04 MED ORDER — OXYTOCIN 40 UNITS IN LACTATED RINGERS INFUSION - SIMPLE MED
INTRAVENOUS | Status: AC
  Administered 2018-01-04: 62.5 mL/h
  Filled 2018-01-04: qty 1000

## 2018-01-04 MED ORDER — TETANUS-DIPHTH-ACELL PERTUSSIS 5-2.5-18.5 LF-MCG/0.5 IM SUSP: 0.5000 mL | Freq: Once | INTRAMUSCULAR | Status: DC

## 2018-01-04 MED ORDER — OXYCODONE-ACETAMINOPHEN 5-325 MG PO TABS: 2.0000 | ORAL_TABLET | ORAL | Status: DC | PRN

## 2018-01-04 MED ORDER — ONDANSETRON HCL 4 MG/2ML IJ SOLN
4.0000 mg | Freq: Four times a day (QID) | INTRAMUSCULAR | Status: DC | PRN
  Administered 2018-01-04: 4 mg via INTRAVENOUS
  Filled 2018-01-04: qty 2

## 2018-01-04 MED ORDER — IBUPROFEN 600 MG PO TABS
600.0000 mg | ORAL_TABLET | Freq: Four times a day (QID) | ORAL | Status: DC
  Administered 2018-01-04 – 2018-01-05 (×5): 600 mg via ORAL
  Filled 2018-01-04 (×5): qty 1

## 2018-01-04 MED ORDER — ONDANSETRON HCL 4 MG PO TABS: 4.0000 mg | ORAL_TABLET | ORAL | Status: DC | PRN

## 2018-01-04 MED ORDER — LACTATED RINGERS IV SOLN: INTRAVENOUS | Status: DC

## 2018-01-04 MED ORDER — WITCH HAZEL-GLYCERIN EX PADS
1.0000 "application " | MEDICATED_PAD | CUTANEOUS | Status: DC | PRN
  Administered 2018-01-04: 1 via TOPICAL

## 2018-01-04 MED ORDER — ZOLPIDEM TARTRATE 5 MG PO TABS: 5.0000 mg | ORAL_TABLET | Freq: Every evening | ORAL | Status: DC | PRN

## 2018-01-04 MED ORDER — DIPHENHYDRAMINE HCL 25 MG PO CAPS: 25.0000 mg | ORAL_CAPSULE | Freq: Four times a day (QID) | ORAL | Status: DC | PRN

## 2018-01-04 MED ORDER — OXYTOCIN BOLUS FROM INFUSION: 500.0000 mL | Freq: Once | INTRAVENOUS | Status: DC

## 2018-01-04 MED ORDER — LACTATED RINGERS IV SOLN
500.0000 mL | Freq: Once | INTRAVENOUS | Status: AC
  Administered 2018-01-04: 500 mL via INTRAVENOUS

## 2018-01-04 MED ORDER — BENZOCAINE-MENTHOL 20-0.5 % EX AERO
1.0000 "application " | INHALATION_SPRAY | CUTANEOUS | Status: DC | PRN
  Administered 2018-01-04: 1 via TOPICAL
  Filled 2018-01-04 (×2): qty 56

## 2018-01-04 MED ORDER — OXYTOCIN BOLUS FROM INFUSION
500.0000 mL | Freq: Once | INTRAVENOUS | Status: AC
  Administered 2018-01-04: 500 mL via INTRAVENOUS

## 2018-01-04 MED ORDER — LIDOCAINE HCL (PF) 1 % IJ SOLN: 30.0000 mL | INTRAMUSCULAR | Status: DC | PRN

## 2018-01-04 MED ORDER — ONDANSETRON HCL 4 MG/2ML IJ SOLN: 4.0000 mg | Freq: Four times a day (QID) | INTRAMUSCULAR | Status: DC | PRN

## 2018-01-04 MED ORDER — EPHEDRINE 5 MG/ML INJ: 10.0000 mg | INTRAVENOUS | Status: DC | PRN

## 2018-01-04 MED ORDER — SOD CITRATE-CITRIC ACID 500-334 MG/5ML PO SOLN: 30.0000 mL | ORAL | Status: DC | PRN

## 2018-01-04 MED ORDER — FENTANYL 2.5 MCG/ML BUPIVACAINE 1/10 % EPIDURAL INFUSION (WH - ANES)
INTRAMUSCULAR | Status: AC
  Filled 2018-01-04: qty 100

## 2018-01-04 MED ORDER — LACTATED RINGERS IV SOLN: 500.0000 mL | INTRAVENOUS | Status: DC | PRN

## 2018-01-04 MED ORDER — OXYCODONE-ACETAMINOPHEN 5-325 MG PO TABS: 1.0000 | ORAL_TABLET | ORAL | Status: DC | PRN

## 2018-01-04 MED ORDER — LIDOCAINE HCL (PF) 1 % IJ SOLN
30.0000 mL | INTRAMUSCULAR | Status: DC | PRN
  Filled 2018-01-04: qty 30

## 2018-01-04 MED ORDER — DIPHENHYDRAMINE HCL 50 MG/ML IJ SOLN: 12.5000 mg | INTRAMUSCULAR | Status: DC | PRN

## 2018-01-04 MED ORDER — OXYTOCIN 40 UNITS IN LACTATED RINGERS INFUSION - SIMPLE MED: 2.5000 [IU]/h | INTRAVENOUS | Status: DC

## 2018-01-04 MED ORDER — PRENATAL MULTIVITAMIN CH
1.0000 | ORAL_TABLET | Freq: Every day | ORAL | Status: DC
  Administered 2018-01-05: 1 via ORAL
  Filled 2018-01-04 (×2): qty 1

## 2018-01-04 MED ORDER — LIDOCAINE HCL (PF) 1 % IJ SOLN
INTRAMUSCULAR | Status: AC
  Filled 2018-01-04: qty 30

## 2018-01-04 MED ORDER — LIDOCAINE HCL (PF) 1 % IJ SOLN
INTRAMUSCULAR | Status: DC | PRN
  Administered 2018-01-04: 3 mL via EPIDURAL
  Administered 2018-01-04: 5 mL via EPIDURAL
  Administered 2018-01-04: 2 mL via EPIDURAL

## 2018-01-04 MED ORDER — SIMETHICONE 80 MG PO CHEW: 80.0000 mg | CHEWABLE_TABLET | ORAL | Status: DC | PRN

## 2018-01-04 MED ORDER — DIBUCAINE 1 % RE OINT
1.0000 "application " | TOPICAL_OINTMENT | RECTAL | Status: DC | PRN
  Filled 2018-01-04: qty 28

## 2018-01-04 MED ORDER — FLEET ENEMA 7-19 GM/118ML RE ENEM: 1.0000 | ENEMA | RECTAL | Status: DC | PRN

## 2018-01-04 MED ORDER — LACTATED RINGERS IV SOLN: 500.0000 mL | Freq: Once | INTRAVENOUS | Status: DC

## 2018-01-04 MED ORDER — ONDANSETRON HCL 4 MG/2ML IJ SOLN: 4.0000 mg | INTRAMUSCULAR | Status: DC | PRN

## 2018-01-04 MED ORDER — PHENYLEPHRINE 40 MCG/ML (10ML) SYRINGE FOR IV PUSH (FOR BLOOD PRESSURE SUPPORT)
PREFILLED_SYRINGE | INTRAVENOUS | Status: AC
  Filled 2018-01-04: qty 10

## 2018-01-04 MED ORDER — FENTANYL CITRATE (PF) 100 MCG/2ML IJ SOLN
50.0000 ug | INTRAMUSCULAR | Status: DC | PRN
  Administered 2018-01-04: 100 ug via INTRAVENOUS
  Filled 2018-01-04: qty 2

## 2018-01-04 MED ORDER — SENNOSIDES-DOCUSATE SODIUM 8.6-50 MG PO TABS
2.0000 | ORAL_TABLET | ORAL | Status: DC
  Administered 2018-01-04: 2 via ORAL
  Filled 2018-01-04: qty 2

## 2018-01-04 MED ORDER — PHENYLEPHRINE 40 MCG/ML (10ML) SYRINGE FOR IV PUSH (FOR BLOOD PRESSURE SUPPORT): 80.0000 ug | PREFILLED_SYRINGE | INTRAVENOUS | Status: DC | PRN

## 2018-01-04 MED ORDER — COCONUT OIL OIL
1.0000 "application " | TOPICAL_OIL | Status: DC | PRN
  Filled 2018-01-04: qty 120

## 2018-01-04 NOTE — Anesthesia Postprocedure Evaluation (Signed)
Anesthesia Post Note  Patient: Amber Dominguez  Procedure(s) Performed: AN AD HOC LABOR EPIDURAL     Patient location during evaluation: Mother Baby Anesthesia Type: Epidural Level of consciousness: awake, awake and alert, oriented and patient cooperative Pain management: pain level controlled Vital Signs Assessment: post-procedure vital signs reviewed and stable Respiratory status: nonlabored ventilation, respiratory function stable and spontaneous breathing Cardiovascular status: stable Postop Assessment: no headache, no backache, patient able to bend at knees and no apparent nausea or vomiting Anesthetic complications: no    Last Vitals:  Vitals:   01/04/18 1035 01/04/18 1240  BP: (!) 128/56 (!) 128/53  Pulse: 75 77  Resp: 18   Temp: 36.7 C 37 C  SpO2:      Last Pain:  Vitals:   01/04/18 1240  TempSrc: Oral  PainSc:    Pain Goal: Patients Stated Pain Goal: 3 (01/04/18 1035)               Gesselle Fitzsimons L

## 2018-01-04 NOTE — Anesthesia Preprocedure Evaluation (Signed)
Anesthesia Evaluation  Patient identified by MRN, date of birth, ID band Patient awake    Reviewed: Allergy & Precautions, NPO status , Patient's Chart, lab work & pertinent test results  Airway Mallampati: II  TM Distance: >3 FB Neck ROM: Full    Dental  (+) Teeth Intact, Dental Advisory Given, Chipped   Pulmonary neg pulmonary ROS,    Pulmonary exam normal breath sounds clear to auscultation       Cardiovascular hypertension, Normal cardiovascular exam Rhythm:Regular Rate:Normal     Neuro/Psych  Headaches,    GI/Hepatic negative GI ROS, Neg liver ROS,   Endo/Other  Morbid obesity  Renal/GU negative Renal ROS     Musculoskeletal negative musculoskeletal ROS (+)   Abdominal   Peds  Hematology negative hematology ROS (+) Plt 213k   Anesthesia Other Findings Day of surgery medications reviewed with the patient.  Reproductive/Obstetrics (+) Pregnancy                             Anesthesia Physical Anesthesia Plan  ASA: III  Anesthesia Plan: Epidural   Post-op Pain Management:    Induction:   PONV Risk Score and Plan: 2 and Treatment may vary due to age or medical condition  Airway Management Planned:   Additional Equipment:   Intra-op Plan:   Post-operative Plan:   Informed Consent: I have reviewed the patients History and Physical, chart, labs and discussed the procedure including the risks, benefits and alternatives for the proposed anesthesia with the patient or authorized representative who has indicated his/her understanding and acceptance.   Dental advisory given  Plan Discussed with:   Anesthesia Plan Comments: (Patient identified. Risks/Benefits/Options discussed with patient including but not limited to bleeding, infection, nerve damage, paralysis, failed block, incomplete pain control, headache, blood pressure changes, nausea, vomiting, reactions to medication both  or allergic, itching and postpartum back pain. Confirmed with bedside nurse the patient's most recent platelet count. Confirmed with patient that they are not currently taking any anticoagulation, have any bleeding history or any family history of bleeding disorders. Patient expressed understanding and wished to proceed. All questions were answered. )        Anesthesia Quick Evaluation

## 2018-01-04 NOTE — MAU Note (Addendum)
0404 pt transported from MAU to L&D with Maye Hides CNM. Pt handed off to charge nurse Royetta Crochet RN.

## 2018-01-04 NOTE — Progress Notes (Signed)
Patient ID: Amber Dominguez, female   DOB: 13-Oct-1990, 27 y.o.   MRN: [id number] Pt was not coping well with UCs  FHR stable and reassuring UCs every 2 min  Dilation: Lip/rim Effacement (%): 80 Station: 0 Presentation: Vertex Exam by:: Hansel Feinstein, CNM  Anterior lip persisted, so decision made to proceed with epidural  Will recheck in  A while after patient labors down.

## 2018-01-04 NOTE — MAU Note (Signed)
Pt presents to MAU c/o SROM @0300  clear fluid with some bloody show. Pt reports a decreased FM. Pt states ctx every 34min.

## 2018-01-04 NOTE — Anesthesia Procedure Notes (Signed)
Epidural Patient location during procedure: OB Start time: 01/04/2018 5:00 AM End time: 01/04/2018 5:07 AM  Staffing Anesthesiologist: Catalina Gravel, MD Performed: anesthesiologist   Preanesthetic Checklist Completed: patient identified, pre-op evaluation, timeout performed, IV checked, risks and benefits discussed and monitors and equipment checked  Epidural Patient position: sitting Prep: DuraPrep Patient monitoring: blood pressure and continuous pulse ox Approach: midline Location: L3-L4 Injection technique: LOR air  Needle:  Needle type: Tuohy  Needle gauge: 17 G Needle length: 9 cm Needle insertion depth: 8 cm Catheter size: 19 Gauge Catheter at skin depth: 14 cm Test dose: negative and Other (1% Lidocaine)  Additional Notes Patient identified.  Risk benefits discussed including failed block, incomplete pain control, headache, nerve damage, paralysis, blood pressure changes, nausea, vomiting, reactions to medication both toxic or allergic, and postpartum back pain.  Patient expressed understanding and wished to proceed.  All questions were answered.  Sterile technique used throughout procedure and epidural site dressed with sterile barrier dressing. No paresthesia or other complications noted. The patient did not experience any signs of intravascular injection such as tinnitus or metallic taste in mouth nor signs of intrathecal spread such as rapid motor block. Please see nursing notes for vital signs. Reason for block:procedure for pain

## 2018-01-04 NOTE — Lactation Note (Signed)
This note was copied from a baby's chart. Lactation Consultation Note  Patient Name: Amber Dominguez [id number] Date: 01/04/2018 Reason for consult: Initial assessment;Term Breastfeeding consultation services and support information given to patient.  Baby is currently on breast and feeding well.  Instructed to feed with cues and call for assist prn.    Maternal Data    Feeding Feeding Type: Breast Fed Length of feed: 20 min  LATCH Score Latch: Grasps breast easily, tongue down, lips flanged, rhythmical sucking.  Audible Swallowing: A few with stimulation  Type of Nipple: Everted at rest and after stimulation  Comfort (Breast/Nipple): Soft / non-tender  Hold (Positioning): Assistance needed to correctly position infant at breast and maintain latch.  LATCH Score: 8  Interventions    Lactation Tools Discussed/Used     Consult Status Consult Status: Follow-up Date: 01/05/18 Follow-up type: In-patient    Ave Filter 01/04/2018, 1:14 PM

## 2018-01-04 NOTE — H&P (Signed)
Amber Dominguez is a 27 y.o. female G2P1001 with IUP at [redacted]w[redacted]d presenting for contractions and leaking of fluid. Pt states she has been having irregular, every 2 minutes contractions, associated with none vaginal bleeding for *1 hours..  Membranes are intact, ruptured, with active fetal movement.   [facility] wks  Prenatal History/Complications:  Past Medical History: Past Medical History:  Diagnosis Date  . Chlamydia   . Headache   . Hypertension     Past Surgical History: Past Surgical History:  Procedure Laterality Date  . TONSILLECTOMY    . WISDOM TOOTH EXTRACTION      Obstetrical History: OB History    Gravida  2   Para  1   Term  1   Preterm      AB      Living  1     SAB      TAB      Ectopic      Multiple      Live Births  1            Social History: Social History   Socioeconomic History  . Marital status: Single    Spouse name: Not on file  . Number of children: Not on file  . Years of education: Not on file  . Highest education level: Not on file  Occupational History  . Not on file  Social Needs  . Financial resource strain: Not on file  . Food insecurity:    Worry: Not on file    Inability: Not on file  . Transportation needs:    Medical: Not on file    Non-medical: Not on file  Tobacco Use  . Smoking status: Never Smoker  . Smokeless tobacco: Never Used  Substance and Sexual Activity  . Alcohol use: Yes    Comment: rarely  . Drug use: No  . Sexual activity: Yes    Comment: last intercourse 09 May 2017  Lifestyle  . Physical activity:    Days per week: Not on file    Minutes per session: Not on file  . Stress: Not on file  Relationships  . Social connections:    Talks on phone: Not on file    Gets together: Not on file    Attends religious service: Not on file    Active member of club or organization: Not on file    Attends meetings of clubs or organizations: Not on file    Relationship status:  Not on file  Other Topics Concern  . Not on file  Social History Narrative  . Not on file    Family History: Family History  Problem Relation Age of Onset  . Heart disease Father     Allergies: Allergies  Allergen Reactions  . Cefzil [Cefprozil] Hives    Coughing, fever    Medications Prior to Admission  Medication Sig Dispense Refill Last Dose  . acetaminophen (TYLENOL) 500 MG tablet Take 1,000 mg by mouth every 6 (six) hours as needed for mild pain or headache.   Past Month at Unknown time  . Prenat-FeAsp-Meth-FA-DHA w/o A (PRENATE PIXIE) 10-0.6-0.4-200 MG CAPS Take 1 tablet by mouth daily. 30 capsule 12 01/03/2018 at Unknown time  . butalbital-acetaminophen-caffeine (FIORICET, ESGIC) 50-325-40 MG tablet Take 1-2 tablets every 6 (six) hours as needed by mouth. (Patient not taking: Reported on 01/03/2018) 45 tablet 4 Not Taking  . Doxylamine-Pyridoxine (DICLEGIS) 10-10 MG TBEC Take 1 tablet with breakfast and lunch.  Take  2 tablets at bedtime. (Patient not taking: Reported on 01/03/2018) 100 tablet 4 Not Taking  . Vitamin D, Ergocalciferol, (DRISDOL) 50000 units CAPS capsule Take 1 capsule (50,000 Units total) by mouth every 7 (seven) days. (Patient not taking: Reported on 01/03/2018) 30 capsule 2 Not Taking        Review of Systems   Constitutional: Negative for fever and chills Eyes: Negative for visual disturbances Respiratory: Negative for shortness of breath, dyspnea Cardiovascular: Negative for chest pain or palpitations  Gastrointestinal: Negative for vomiting, diarrhea and constipation.  POSITIVE for abdominal pain (contractions) Genitourinary: Negative for dysuria and urgency Musculoskeletal: Negative for back pain, joint pain, myalgias  Neurological: Negative for dizziness and headaches      Blood pressure (!) 141/61, pulse 83, temperature 98.2 F (36.8 C), temperature source Oral, last menstrual period 04/07/2017, SpO2 99 %. General appearance: alert Lungs:  clear to auscultation bilaterally Heart: regular rate and rhythm Abdomen: soft, non-tender; bowel sounds normal Extremities: Homans sign is negative, no sign of DVT DTR's 2+ Presentation: cephalic Fetal monitoring  Baseline: 130 bpm mod var,  Present acel, neg decel, frequent contractions Uterine activity  q 2     Prenatal labs: ABO, Rh: O/Positive/-- (10/15 1051) Antibody: Negative (10/15 1051) Rubella: 3.02 (10/15 1051) RPR: Non Reactive (02/07 1023)  HBsAg: Negative (10/15 1051)  HIV: Non Reactive (02/07 1023)  GBS:   gbs neg 1 hr Glucola neg Genetic screening  normal Anatomy US normal,  Growth scans normal.   Prenatal Transfer Tool  Maternal Diabetes: No Genetic Screening: Normal Maternal Ultrasounds/Referrals: Normal Fetal Ultrasounds or other Referrals:  Other: [place] for CHTN Maternal Substance Abuse:  No Significant Maternal Medications:  None Significant Maternal Lab Results: None     Results for orders placed or performed during the hospital encounter of 01/04/18 (from the past 24 hour(s))  Fern Test   Collection Time: 01/04/18  3:55 AM  Result Value Ref Range   POCT Fern Test Positive = ruptured amniotic membanes   POCT fern test   Collection Time: 01/04/18  3:58 AM  Result Value Ref Range   POCT Fern Test Positive = ruptured amniotic membanes     Assessment: Amber Dominguez is a 27 y.o. G2P1001 with an IUP at [redacted]w[redacted]d presenting for SROM and active labor.   Plan: #Labor: expectant management #Pain:  Per request #FWB Cat 1 #ID: GBS: neg #MOF:  breast #MOC: nexplanon.  #Circ: yes   Mervyn Skeeters [facility] 01/04/2018, 4:13 AM

## 2018-01-05 LAB — BIRTH TISSUE RECOVERY COLLECTION (PLACENTA DONATION)

## 2018-01-05 LAB — RPR: RPR Ser Ql: NONREACTIVE

## 2018-01-05 MED ORDER — IBUPROFEN 600 MG PO TABS: 600.0000 mg | ORAL_TABLET | Freq: Four times a day (QID) | ORAL | 0 refills | Status: DC

## 2018-01-05 NOTE — Discharge Instructions (Signed)
Postpartum Care After Vaginal Delivery °The period of time right after you deliver your newborn is called the postpartum period. °What kind of medical care will I receive? °· You may continue to receive fluids and medicines through an IV tube inserted into one of your veins. °· If an incision was made near your vagina (episiotomy) or if you had some vaginal tearing during delivery, cold compresses may be placed on your episiotomy or your tear. This helps to reduce pain and swelling. °· You may be given a squirt bottle to use when you go to the bathroom. You may use this until you are comfortable wiping as usual. To use the squirt bottle, follow these steps: °? Before you urinate, fill the squirt bottle with warm water. Do not use hot water. °? After you urinate, while you are sitting on the toilet, use the squirt bottle to rinse the area around your urethra and vaginal opening. This rinses away any urine and blood. °? You may do this instead of wiping. As you start healing, you may use the squirt bottle before wiping yourself. Make sure to wipe gently. °? Fill the squirt bottle with clean water every time you use the bathroom. °· You will be given sanitary pads to wear. °How can I expect to feel? °· You may not feel the need to urinate for several hours after delivery. °· You will have some soreness and pain in your abdomen and vagina. °· If you are breastfeeding, you may have uterine contractions every time you breastfeed for up to several weeks postpartum. Uterine contractions help your uterus return to its normal size. °· It is normal to have vaginal bleeding (lochia) after delivery. The amount and appearance of lochia is often similar to a menstrual period in the first week after delivery. It will gradually decrease over the next few weeks to a dry, yellow-brown discharge. For most women, lochia stops completely by 6-8 weeks after delivery. Vaginal bleeding can vary from woman to woman. °· Within the first few  days after delivery, you may have breast engorgement. This is when your breasts feel heavy, full, and uncomfortable. Your breasts may also throb and feel hard, tightly stretched, warm, and tender. After this occurs, you may have milk leaking from your breasts. Your health care provider can help you relieve discomfort due to breast engorgement. Breast engorgement should go away within a few days. °· You may feel more sad or worried than normal due to hormonal changes after delivery. These feelings should not last more than a few days. If these feelings do not go away after several days, speak with your health care provider. °How should I care for myself? °· Tell your health care provider if you have pain or discomfort. °· Drink enough water to keep your urine clear or pale yellow. °· Wash your hands thoroughly with soap and water for at least 20 seconds after changing your sanitary pads, after using the toilet, and before holding or feeding your baby. °· If you are not breastfeeding, avoid touching your breasts a lot. Doing this can make your breasts produce more milk. °· If you become weak or lightheaded, or you feel like you might faint, ask for help before: °? Getting out of bed. °? Showering. °· Change your sanitary pads frequently. Watch for any changes in your flow, such as a sudden increase in volume, a change in color, the passing of large blood clots. If you pass a blood clot from your vagina, save it   to show to your health care provider. Do not flush blood clots down the toilet without having your health care provider look at them. °· Make sure that all your vaccinations are up to date. This can help protect you and your baby from getting certain diseases. You may need to have immunizations done before you leave the hospital. °· If desired, talk with your health care provider about methods of family planning or birth control (contraception). °How can I start bonding with my baby? °Spending as much time as  possible with your baby is very important. During this time, you and your baby can get to know each other and develop a bond. Having your baby stay with you in your room (rooming in) can give you time to get to know your baby. Rooming in can also help you become comfortable caring for your baby. Breastfeeding can also help you bond with your baby. °How can I plan for returning home with my baby? °· Make sure that you have a car seat installed in your vehicle. °? Your car seat should be checked by a certified car seat installer to make sure that it is installed safely. °? Make sure that your baby fits into the car seat safely. °· Ask your health care provider any questions you have about caring for yourself or your baby. Make sure that you are able to contact your health care provider with any questions after leaving the hospital. °This information is not intended to replace advice given to you by your health care provider. Make sure you discuss any questions you have with your health care provider. °Document Released: 06/28/2007 Document Revised: 02/03/2016 Document Reviewed: 08/05/2015 °Elsevier Interactive Patient Education © 2018 Elsevier Inc. ° °

## 2018-01-05 NOTE — Discharge Summary (Addendum)
OB Discharge Summary     Patient Name: Amber Dominguez DOB: [birth year] MRN: [id number]  Date of admission: 01/04/2018 Delivering MD: Seabron Spates   Date of discharge: 01/05/2018  Admitting diagnosis: 38.4 WEEKS CTX ROM Intrauterine pregnancy: [redacted]w[redacted]d     Secondary diagnosis:  Active Problems:   NSVD (normal spontaneous vaginal delivery)   Postpartum care following vaginal delivery  Additional problems: Chronic Hypertension     Discharge diagnosis: Term Pregnancy Delivered and CHTN                                                                                                Post partum procedures:None  Augmentation: None  Complications: None  Hospital course:  Onset of Labor With Vaginal Delivery     27 y.o. yo [id number] at [redacted]w[redacted]d was admitted in Active Labor on 01/04/2018. Patient had an uncomplicated labor course as follows:  Membrane Rupture Time/Date: 3:00 AM ,01/04/2018   Intrapartum Procedures: Episiotomy: None [1]                                         Lacerations:  None [1]  Patient had a delivery of a Viable infant. 01/04/2018  Information for the patient's newborn:  Bridget, [place] [[id number]]  Delivery Method: Vaginal, Spontaneous(Filed from Delivery Summary)    Pateint had an uncomplicated postpartum course.  She is ambulating, tolerating a regular diet, passing flatus, and urinating well. BPs remained wnl postpartum. Patient is discharged home in stable condition on 01/05/18.   Physical exam  Vitals:   01/04/18 1445 01/04/18 2057 01/04/18 2300 01/05/18 0440  BP: 120/71 130/65 122/65 128/75  Pulse: 73 71 78 74  Resp: 18     Temp: 98.3 F (36.8 C)  98.4 F (36.9 C) 98 F (36.7 C)  TempSrc: Oral  Oral Oral  SpO2:      Weight:      Height:       General: alert, cooperative and no distress Lochia: appropriate Uterine Fundus: firm Incision: N/A DVT Evaluation: No evidence of DVT seen on physical exam. Labs: Lab Results  Component Value  Date   WBC 12.1 (H) 01/04/2018   HGB 13.6 01/04/2018   HCT 39.9 01/04/2018   MCV 90.9 01/04/2018   PLT 213 01/04/2018   CMP Latest Ref Rng & Units 01/04/2018  Glucose 65 - 99 mg/dL 89  BUN 6 - 20 mg/dL 8  Creatinine 0.44 - 1.00 mg/dL 0.54  Sodium 135 - 145 mmol/L 137  Potassium 3.5 - 5.1 mmol/L 3.6  Chloride 101 - 111 mmol/L 109  CO2 22 - 32 mmol/L 17(L)  Calcium 8.9 - 10.3 mg/dL 8.9  Total Protein 6.5 - 8.1 g/dL 6.5  Total Bilirubin 0.3 - 1.2 mg/dL 0.3  Alkaline Phos 38 - 126 U/L 145(H)  AST 15 - 41 U/L 22  ALT 14 - 54 U/L 13(L)    Discharge instruction: per After Visit Summary and "Baby and Me Booklet".  After visit meds:  Allergies as of  01/05/2018      Reactions   Cefzil [cefprozil] Hives   Coughing, fever      Medication List    TAKE these medications   acetaminophen 500 MG tablet Commonly known as:  TYLENOL Take 1,000 mg by mouth every 6 (six) hours as needed for mild pain or headache.   ibuprofen 600 MG tablet Commonly known as:  ADVIL,MOTRIN Take 1 tablet (600 mg total) by mouth every 6 (six) hours.   PRENATE PIXIE 10-0.6-0.4-200 MG Caps Take 1 tablet by mouth daily.       Diet: routine diet  Activity: Advance as tolerated. Pelvic rest for 6 weeks.   Outpatient follow up:1 week for blood pressure check; then 4 weeks for postpartum care Follow up Appt: Future Appointments  Date Time Provider [place]  01/19/2018  2:00 PM [place] None  02/02/2018 11:00 AM Shelly Bombard, MD [place] None   Follow up Visit:No follow-ups on file.  Postpartum contraception: Depo Provera  Newborn Data: Live born female  Birth Weight: 6 lb 5.6 oz (2880 g) APGAR: 8, 9  Newborn Delivery   Birth date/time:  01/04/2018 07:31:00 Delivery type:  Vaginal, Spontaneous     Baby Feeding: Breast Disposition:home with mother   01/05/2018 Crissie Sickles, MD  OB FELLOW DISCHARGE ATTESTATION  I have seen and examined this patient. I agree with  above documentation and have made edits as needed.   Luiz Blare, DO OB Fellow 1:27 PM

## 2018-01-06 ENCOUNTER — Other Ambulatory Visit: Payer: Medicaid Other

## 2018-01-06 ENCOUNTER — Encounter: Payer: Medicaid Other | Admitting: Obstetrics and Gynecology

## 2018-01-10 ENCOUNTER — Other Ambulatory Visit: Payer: Medicaid Other

## 2018-01-11 ENCOUNTER — Telehealth: Payer: Self-pay

## 2018-01-11 NOTE — Telephone Encounter (Signed)
TC from [place] w/ [facility] to report pt B/P.  B/P:118/70 No swelling Noted HA's and halo's  Hx of migraines per pt  Takes IBP 600 mg   I called pt to see how she was doing.  She states she is fine just notes headaches may see a ring that will go away . Pt states she had 3 12oz glasses of water (maybe) Was not on B/P medication  And stopped asa 81 mg at 36wks.

## 2018-01-13 ENCOUNTER — Encounter: Payer: Self-pay | Admitting: Obstetrics and Gynecology

## 2018-01-13 ENCOUNTER — Ambulatory Visit (INDEPENDENT_AMBULATORY_CARE_PROVIDER_SITE_OTHER): Payer: Medicaid Other

## 2018-01-13 VITALS — BP 138/86 | HR 84 | Ht 65.0 in | Wt 239.2 lb

## 2018-01-13 DIAGNOSIS — O165 Unspecified maternal hypertension, complicating the puerperium: Secondary | ICD-10-CM

## 2018-01-13 MED ORDER — AMLODIPINE BESYLATE 5 MG PO TABS: 5.0000 mg | ORAL_TABLET | Freq: Every day | ORAL | 0 refills | Status: DC

## 2018-01-13 NOTE — Progress Notes (Signed)
I have reviewed the chart and agree with nursing staff's documentation of this patient's encounter.  [name], MD 01/13/2018 11:23 AM

## 2018-01-13 NOTE — Progress Notes (Signed)
Subjective:  Amber Dominguez is a 27 y.o. female here for BP check. She is 9 days PP.  Hypertension ROS: taking medications as instructed, no medication side effects noted, no TIA's, no chest pain on exertion, no dyspnea on exertion and no swelling of ankles.  Complains of Headaches 5-8/10 x 9 days. [place] intermittently.    Objective:  BP 138/86   Pulse 84   Ht 5\' 5"  (1.651 m)   Wt 239 lb 3.2 oz (108.5 kg)   Breastfeeding? Yes   BMI 39.80 kg/m   Appearance alert, well appearing, and in no distress. General exam BP noted to be well controlled today in office.    Assessment:   Blood Pressure stable.   Plan:  Low dose BP Rx. Return in one week for BP check.

## 2018-01-14 ENCOUNTER — Inpatient Hospital Stay (HOSPITAL_COMMUNITY): Admission: RE | Admit: 2018-01-14 | Payer: Medicaid Other | Source: Ambulatory Visit

## 2018-01-14 ENCOUNTER — Other Ambulatory Visit: Payer: Self-pay | Admitting: Obstetrics

## 2018-01-18 ENCOUNTER — Encounter: Payer: Self-pay | Admitting: Certified Nurse Midwife

## 2018-01-18 ENCOUNTER — Ambulatory Visit (INDEPENDENT_AMBULATORY_CARE_PROVIDER_SITE_OTHER): Payer: Medicaid Other | Admitting: Certified Nurse Midwife

## 2018-01-18 VITALS — BP 133/84 | HR 98 | Ht 65.0 in | Wt 237.8 lb

## 2018-01-18 DIAGNOSIS — G44201 Tension-type headache, unspecified, intractable: Secondary | ICD-10-CM

## 2018-01-18 MED ORDER — BUTALBITAL-APAP-CAFFEINE 50-325-40 MG PO TABS: 1.0000 | ORAL_TABLET | Freq: Four times a day (QID) | ORAL | 4 refills | Status: DC | PRN

## 2018-01-18 NOTE — Progress Notes (Signed)
2 weeks PP, presents for BP check. C/o headaches 4/10 and [place]  X 2 weeks.  Denies swelling and  Dizziness.  Subjective:  Amber Dominguez is a 27 y.o. female here for BP check.   Hypertension ROS: taking medications as instructed, no medication side effects noted, no TIA's, no chest pain on exertion, no dyspnea on exertion and no swelling of ankles.    Objective:  BP 133/84   Pulse 98   Ht 5\' 5"  (1.651 m)   Wt 237 lb 12.8 oz (107.9 kg)   Breastfeeding? Yes   BMI 39.57 kg/m   Appearance alert, well appearing, and in no distress. General exam BP noted to be well controlled today in office.    Assessment:   Blood Pressure stable.   Plan:  Current treatment plan is effective, no change in therapy. Fioricet prescribed for headaches.

## 2018-01-19 ENCOUNTER — Ambulatory Visit: Payer: Medicaid Other

## 2018-01-19 NOTE — Progress Notes (Signed)
Patient seen by CMA.  No pregnancy test needed, is not sexually active. B/P normal range with medications.  Reports HA, medications sent to pharmacy.  F/U recommended if HA not improved with medications.  F/U 2 weeks PP exam.

## 2018-02-02 ENCOUNTER — Encounter: Payer: Self-pay | Admitting: Obstetrics

## 2018-02-02 ENCOUNTER — Ambulatory Visit (INDEPENDENT_AMBULATORY_CARE_PROVIDER_SITE_OTHER): Payer: Medicaid Other | Admitting: Obstetrics

## 2018-02-02 VITALS — BP 107/64 | HR 89 | Wt 236.7 lb

## 2018-02-02 DIAGNOSIS — Z3009 Encounter for other general counseling and advice on contraception: Secondary | ICD-10-CM | POA: Diagnosis not present

## 2018-02-02 DIAGNOSIS — Z30011 Encounter for initial prescription of contraceptive pills: Secondary | ICD-10-CM

## 2018-02-02 MED ORDER — NORETHINDRONE 0.35 MG PO TABS: 1.0000 | ORAL_TABLET | Freq: Every day | ORAL | 11 refills | Status: DC

## 2018-02-02 NOTE — Progress Notes (Signed)
Post Partum Exam  Amber Dominguez is a 27 y.o. G65P2002 female who presents for a postpartum visit. She is 4 weeks postpartum following a spontaneous vaginal delivery. I have fully reviewed the prenatal and intrapartum course. The delivery was at [redacted]w[redacted]d gestational weeks.  Anesthesia: epidural. Postpartum course has been UNREMARKABLE. Baby's course has been UNREMARKABLE. Baby is feeding by both breast and bottle - Similac with Iron. Bleeding no bleeding and spotting. . Bowel function is normal. Bladder function is normal. Patient is not sexually active. Contraception method is none. Postpartum depression screening:neg  The following portions of the patient's history were reviewed and updated as appropriate: allergies, current medications, past family history, past medical history, past social history, past surgical history and problem list. Last pap smear done 06/28/17 and was Normal  Review of Systems A comprehensive review of systems was negative.    Objective:  Blood pressure 107/64, pulse 89, weight 236 lb 11.2 oz (107.4 kg), currently breastfeeding.  General:  alert and no distress   Breasts:  inspection negative, no nipple discharge or bleeding, no masses or nodularity palpable  Lungs: clear to auscultation bilaterally  Heart:  regular rate and rhythm, S1, S2 normal, no murmur, click, rub or gallop  Abdomen: soft, non-tender; bowel sounds normal; no masses,  no organomegaly   Assessment:    1. Encounter for other general counseling and advice on contraception  2. Encounter for initial prescription of contraceptive pills Rx: - norethindrone (MICRONOR,CAMILA,ERRIN) 0.35 MG tablet; Take 1 tablet (0.35 mg total) by mouth daily.  Dispense: 1 Package; Refill: 11  Plan:   1. Contraception: oral progesterone-only contraceptive 2. Micronor Rx 3. Follow up in: 5 months or as needed.    Shelly Bombard MD 02-02-2018

## 2018-02-27 ENCOUNTER — Encounter: Payer: Self-pay | Admitting: Obstetrics

## 2018-11-06 ENCOUNTER — Other Ambulatory Visit: Payer: Self-pay | Admitting: Obstetrics

## 2018-11-06 DIAGNOSIS — Z30011 Encounter for initial prescription of contraceptive pills: Secondary | ICD-10-CM

## 2018-11-09 ENCOUNTER — Ambulatory Visit (INDEPENDENT_AMBULATORY_CARE_PROVIDER_SITE_OTHER): Payer: Medicaid Other

## 2018-11-09 VITALS — BP 124/78 | HR 75 | Wt 216.0 lb

## 2018-11-09 DIAGNOSIS — Z3202 Encounter for pregnancy test, result negative: Secondary | ICD-10-CM | POA: Diagnosis not present

## 2018-11-09 LAB — POCT URINE PREGNANCY: Preg Test, Ur: NEGATIVE

## 2018-11-09 NOTE — Progress Notes (Signed)
Ms. Stroh presents today for UPT. She has no unusual complaints. LMP: 11/01/2018    OBJECTIVE: Appears well, in no apparent distress.  OB History    Gravida  2   Para  2   Term  2   Preterm      AB      Living  2     SAB      TAB      Ectopic      Multiple  0   Live Births  2          Home UPT Result: FAINT POSITIVE In-Office UPT result: NEGATIVE I have reviewed the patient's medical, obstetrical, social, and family histories, and medications.   ASSESSMENT: NEGATIVE pregnancy test  PLAN Prenatal care to be completed at:

## 2019-05-13 IMAGING — US US OB LIMITED
1 series · 6 of 6 positions shown · non-contrast
Comparison: none

[Series 1: us ob limited · 6 of 6 slices shown]
[im 1/6]
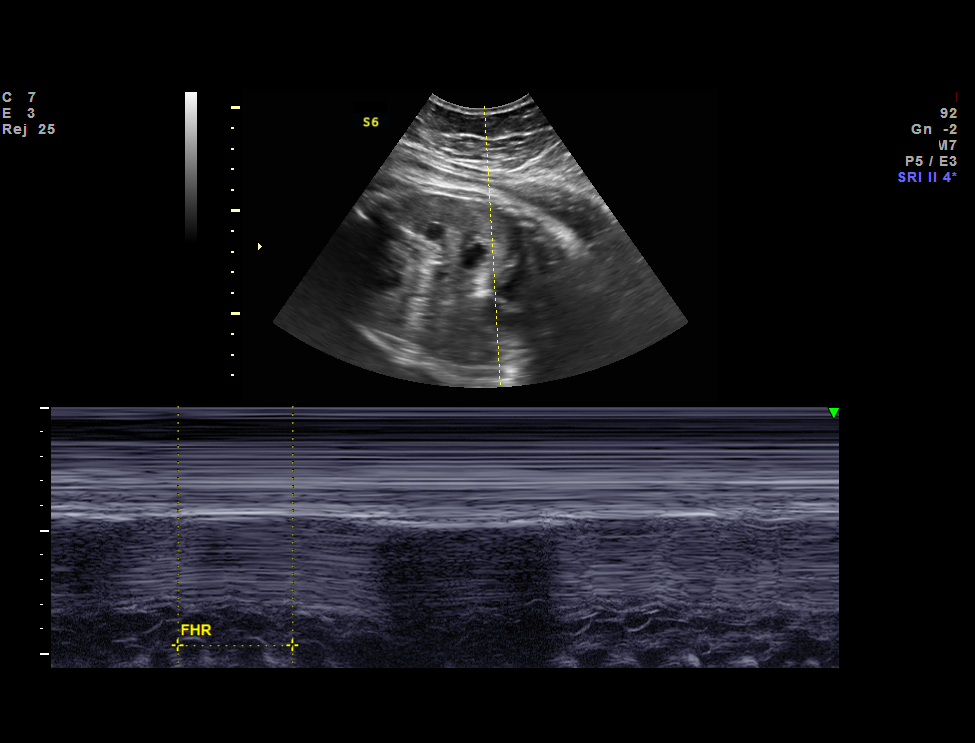
[im 2/6]
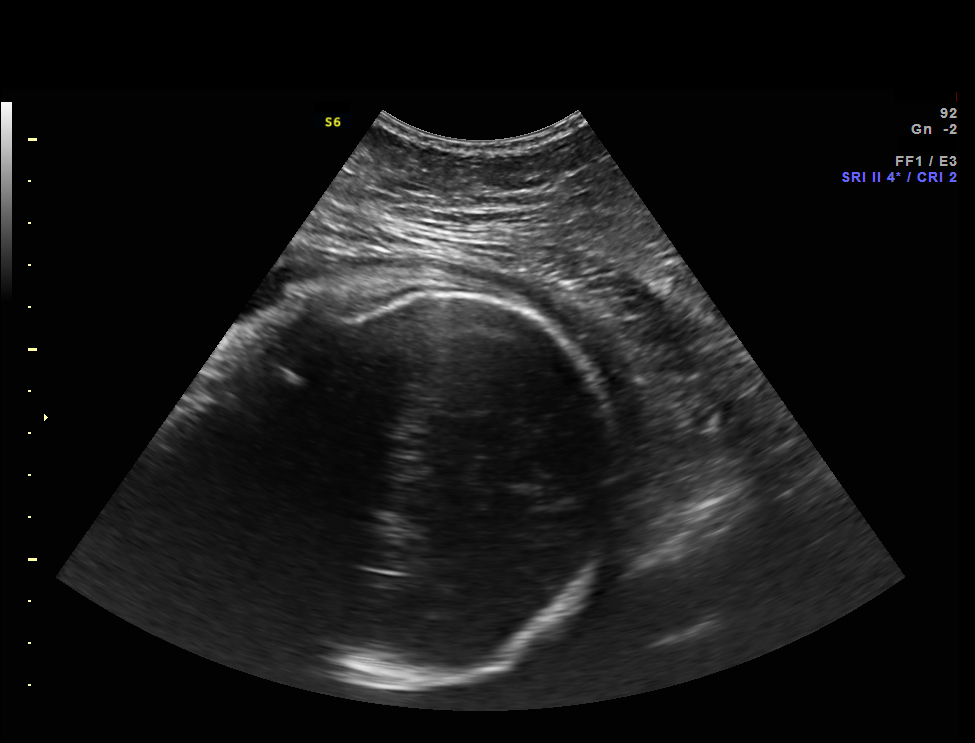
[im 3/6]
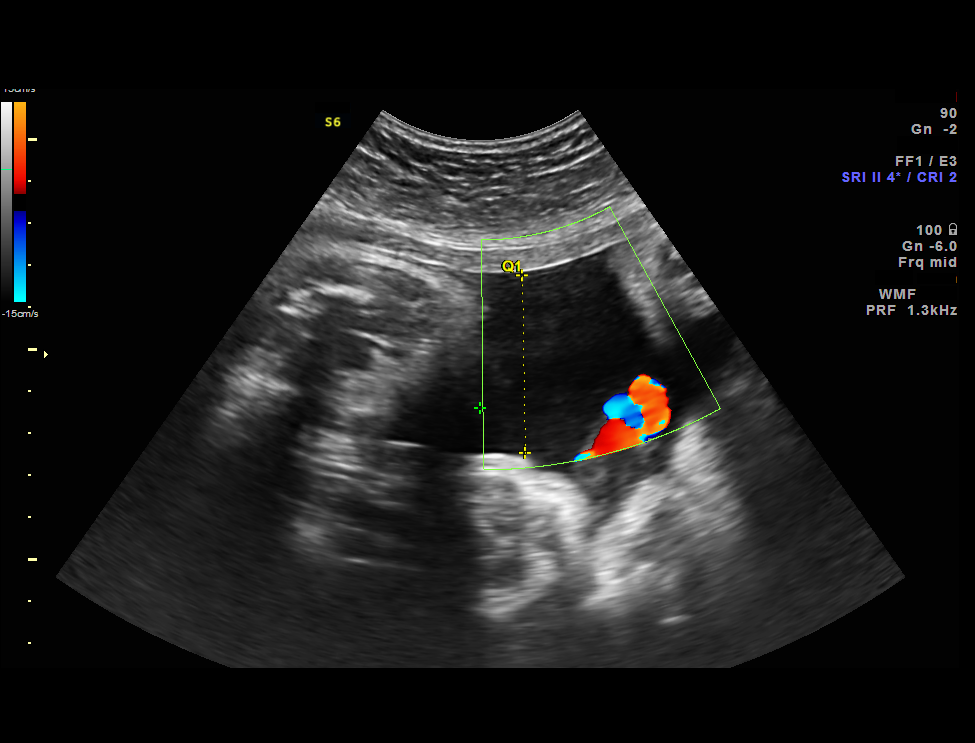
[im 4/6]
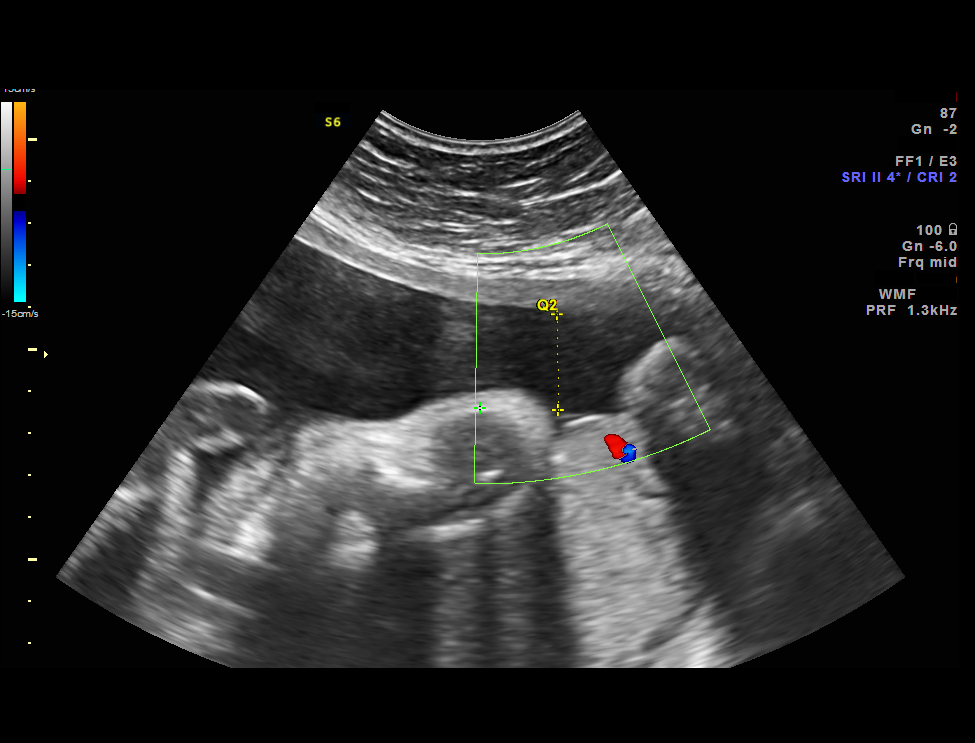
[im 5/6]
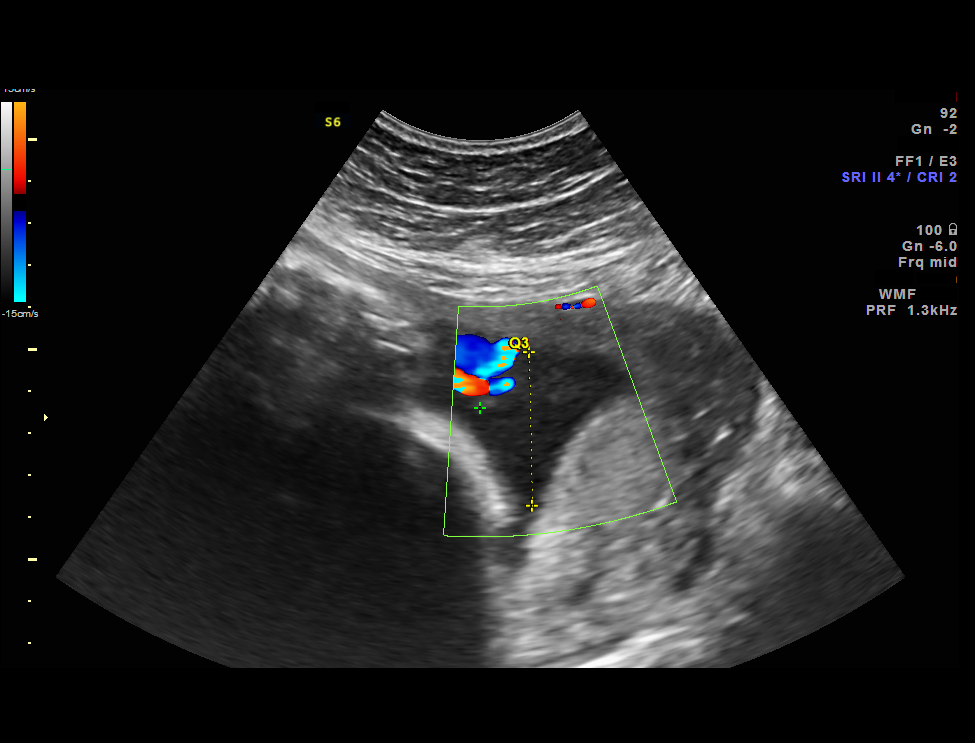
[im 6/6]
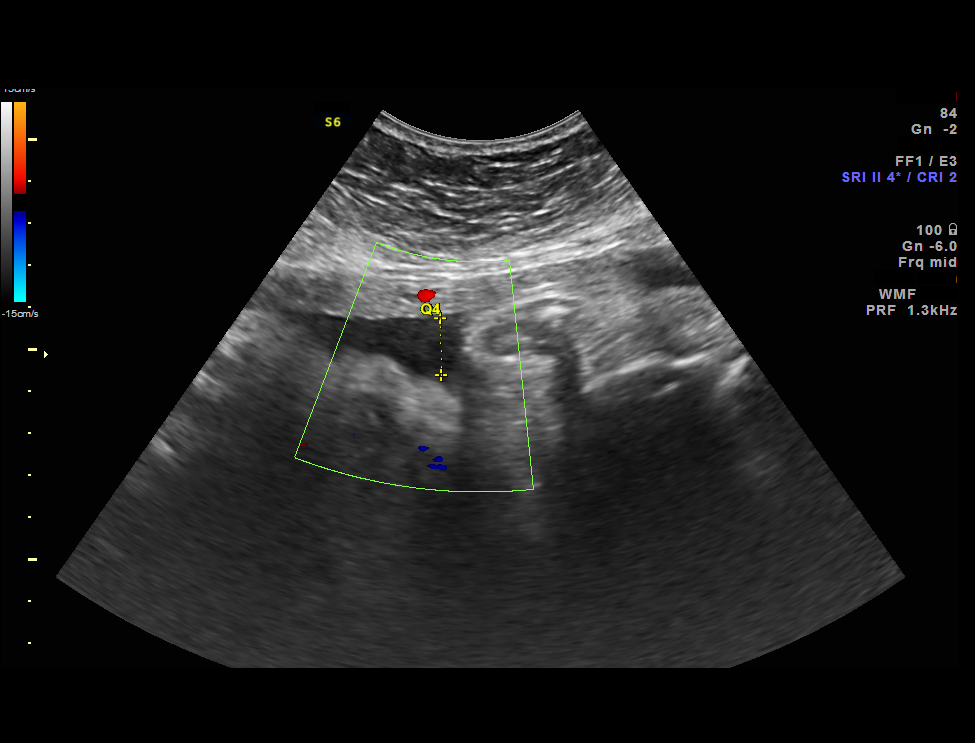

[6 of 6 positions shown; findings below may reference images not displayed]

Road [HOSPITAL]
[REDACTED]care

1  [HOSPITAL]                               [address].0

1  SHIGEMASA CHANAKA             [id number]      [id number]     [id number]
Indications

33 weeks gestation of pregnancy
Pre-existing essential hypertension
complicating pregnancy, third trimester
OB History

Gravidity:    2         Term:   1        Prem:   0        SAB:   0
TOP:          0       Ectopic:  0        Living: 1
Fetal Evaluation

Num Of Fetuses:     1
Fetal Heart         157
Rate(bpm):
Cardiac Activity:   Observed
Presentation:       Vertex

Amniotic Fluid
AFI FV:      Subjectively within normal limits

AFI Sum(cm)     %Tile       Largest Pocket(cm)
11.53           30

RUQ(cm)       RLQ(cm)       LUQ(cm)        LLQ(cm)
4.23
Gestational Age
Best:          33w 6d     Det. By:  Early Ultrasound         EDD:   01/14/18
(05/19/17)
Comments

Normal amniotic fluid volume.
Impression

IUP at  77w0d
Normal amniotic fluid volume
Recommendations

Continue antenatal testing.  Recommend delivery by 39
weeks if maternal and fetal status remain reassuring.

## 2019-05-20 IMAGING — US US OB LIMITED
1 series · 6 of 6 positions shown · non-contrast
Comparison: none

[Series 1: us ob limited · 0.24mm/px · 6 of 6 slices shown]
[im 1/6]
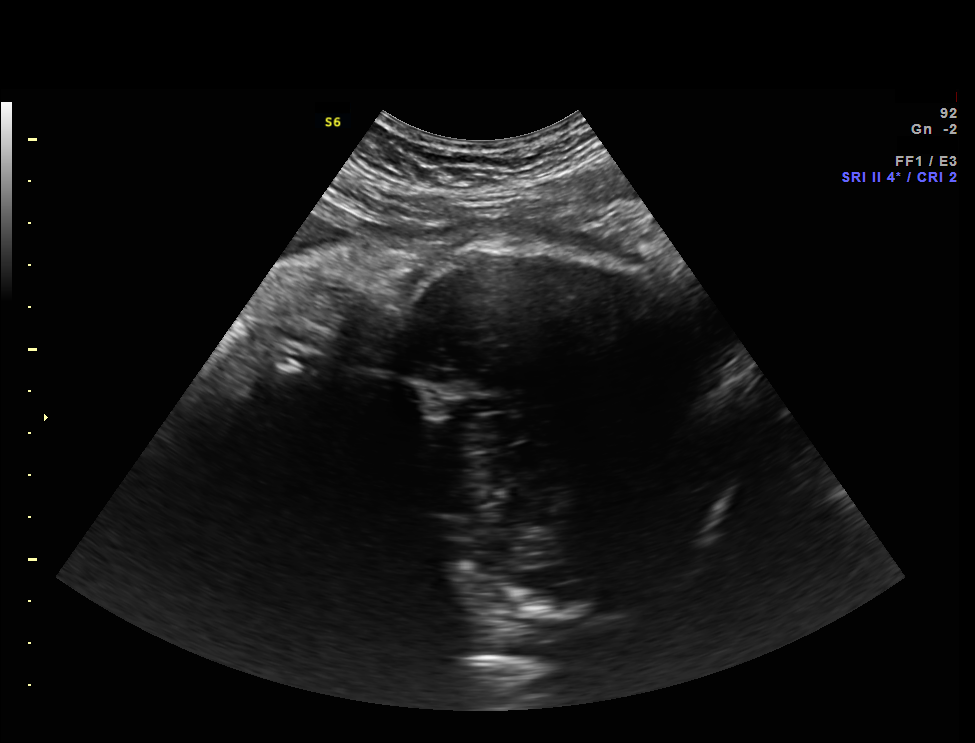
[im 2/6]
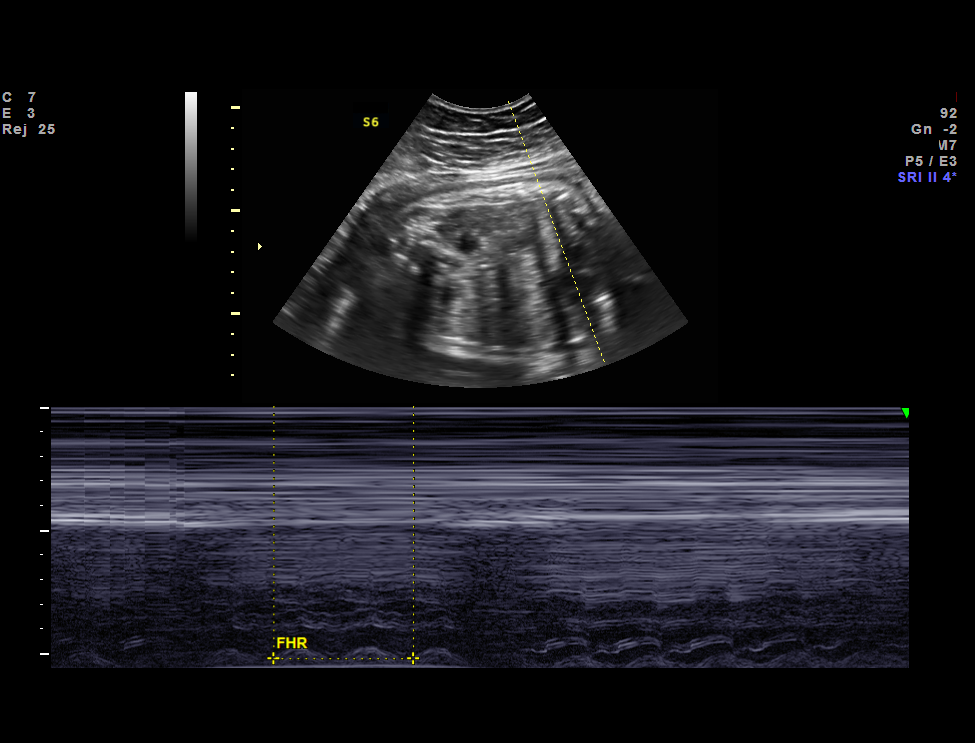
[im 3/6]
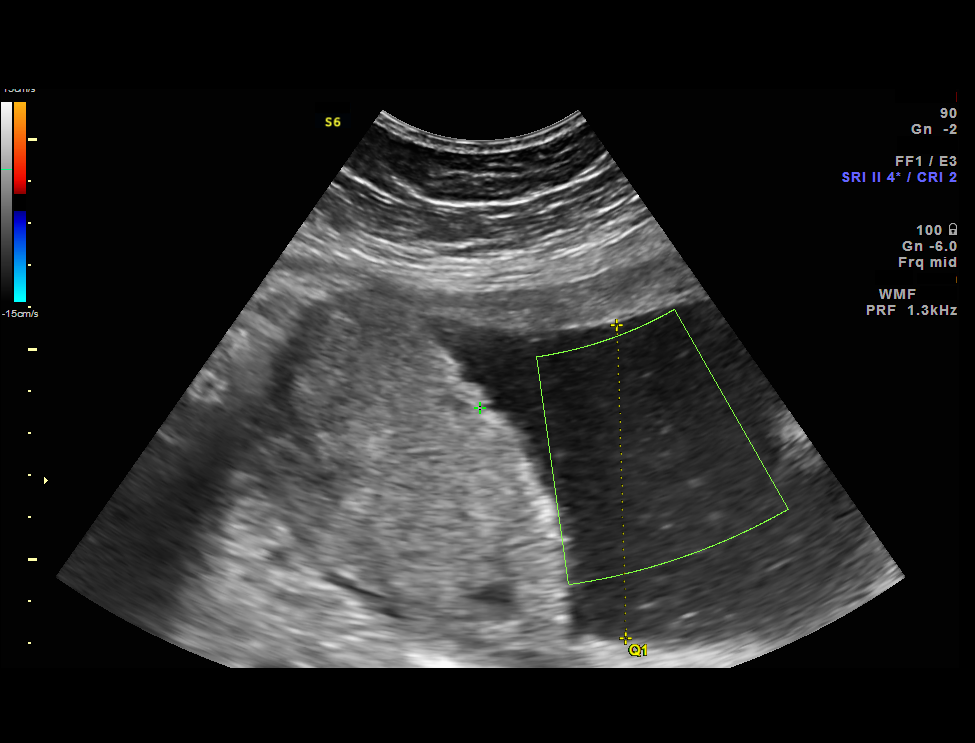
[im 4/6]
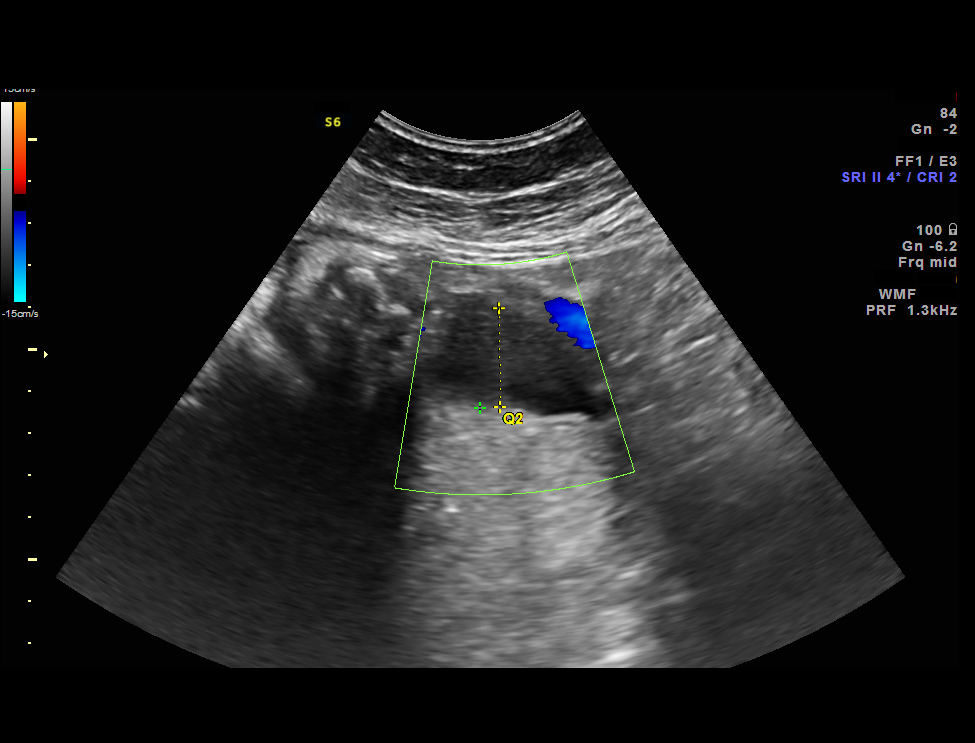
[im 5/6]
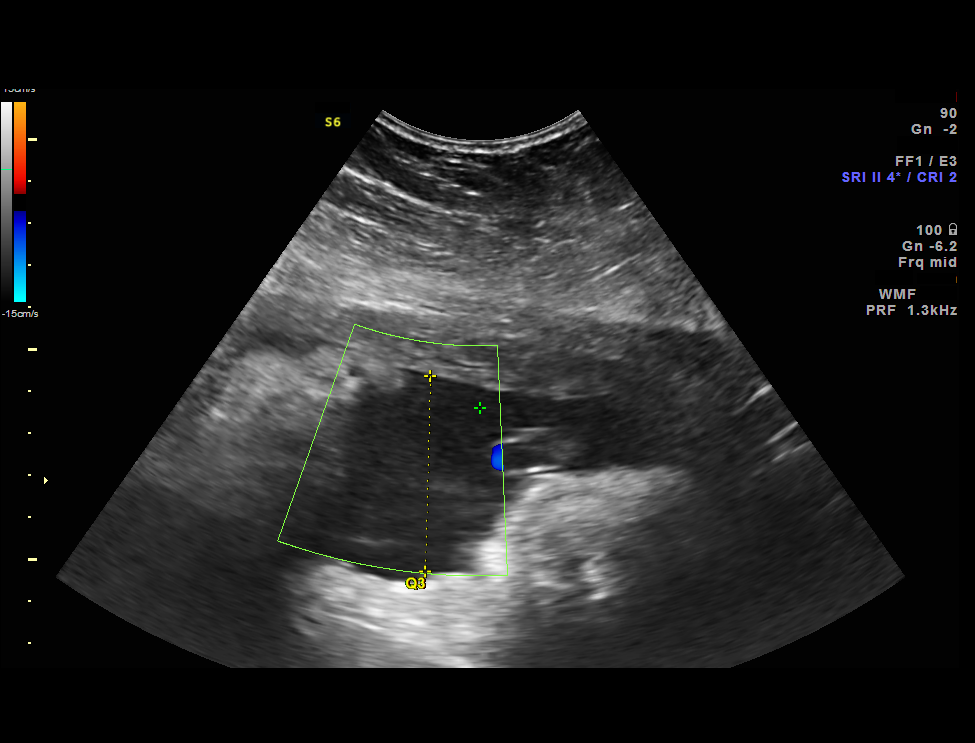
[im 6/6]
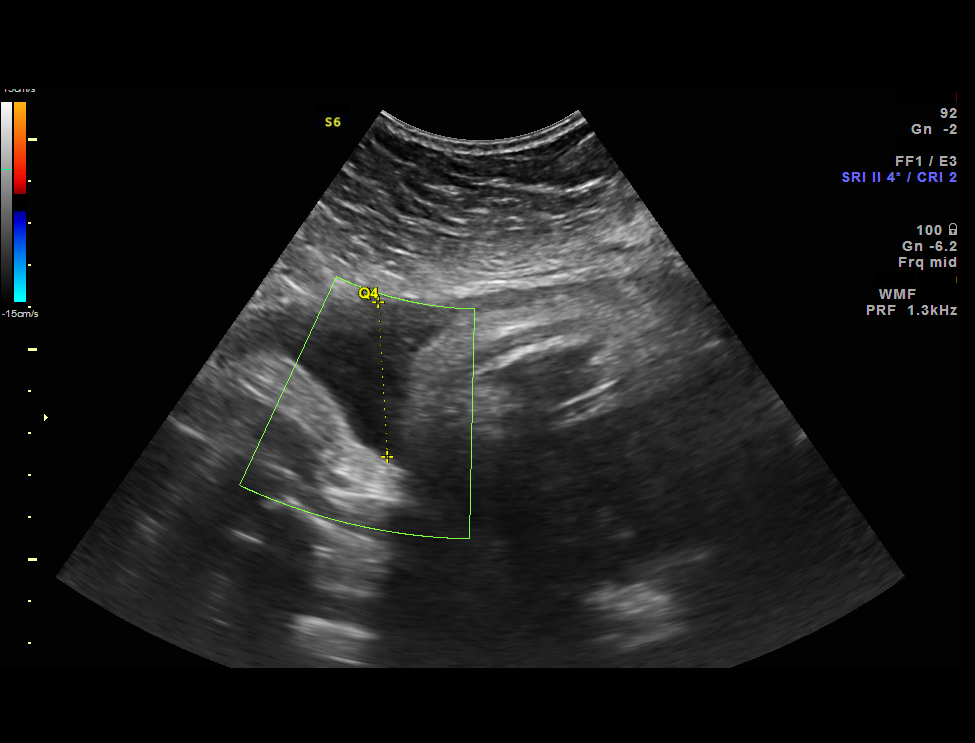

[6 of 6 positions shown; findings below may reference images not displayed]

Road [HOSPITAL]
[REDACTED]care

1  [HOSPITAL]                               [address].0

1  RUDIS FABELA           [id number]      [id number]     [id number]
Indications

34 weeks gestation of pregnancy
Hypertension - Chronic/Pre-existing
OB History

Gravidity:    2         Term:   1        Prem:   0        SAB:   0
TOP:          0       Ectopic:  0        Living: 1
Fetal Evaluation

Num Of Fetuses:     1
Fetal Heart         129
Rate(bpm):
Cardiac Activity:   Observed
Presentation:       Cephalic

Amniotic Fluid
AFI FV:      Subjectively within normal limits

AFI Sum(cm)     %Tile       Largest Pocket(cm)
18.17           67

RUQ(cm)       RLQ(cm)       LUQ(cm)        LLQ(cm)
7.47
Gestational Age

Best:          34w 6d     Det. By:  Early Ultrasound         EDD:   01/14/18
(05/19/17)
Comments

Normal amniotic fluid volume.
Impression

Viable fetus in cephalic presentation with normal AFI
Recommendations

Continue antenatal testing and serial ultrasound for fetal
growth.

## 2019-05-31 IMAGING — US US MFM OB FOLLOW-UP
1 series · 14 of 28 positions shown · non-contrast
Comparison: none

[Series 1: us mfm ob follow-up · 14 of 34 slices shown]
[im 2/34]
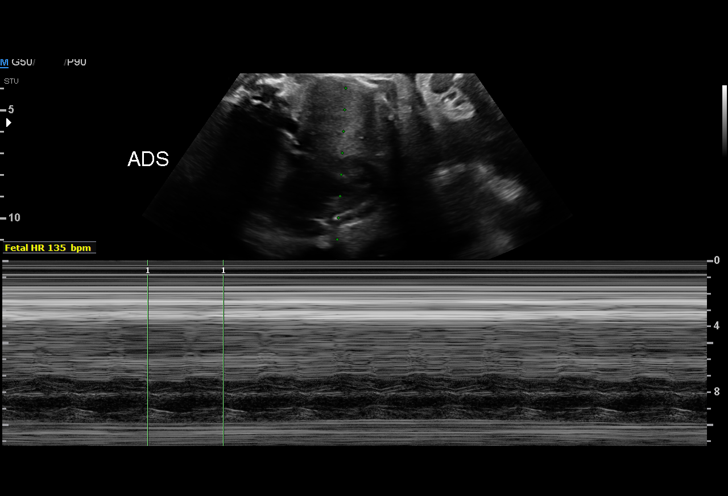
[im 4/34]
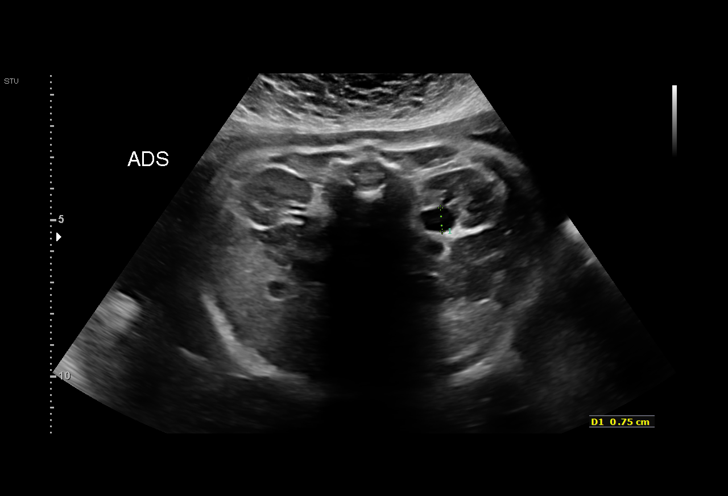
[im 7/34]
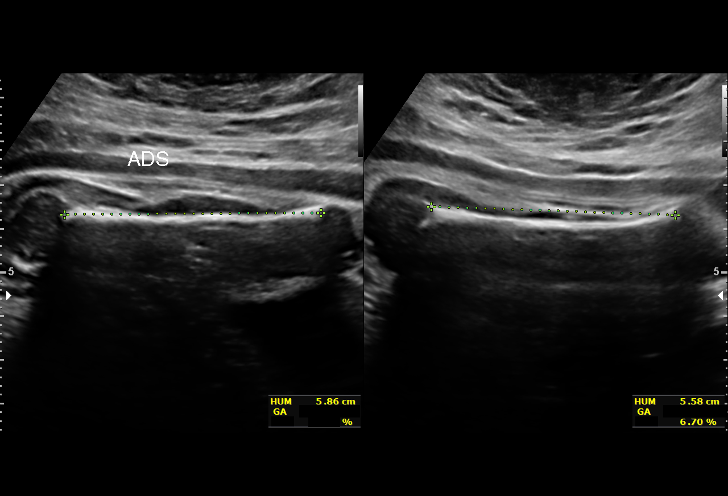
[im 9/34]
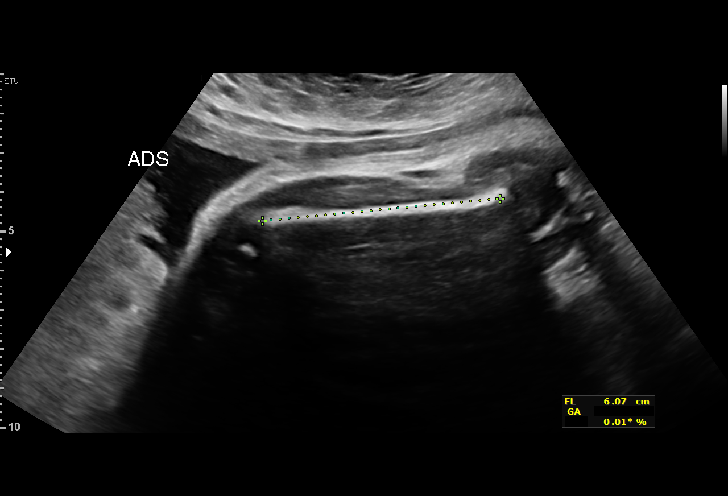
[im 12/34]
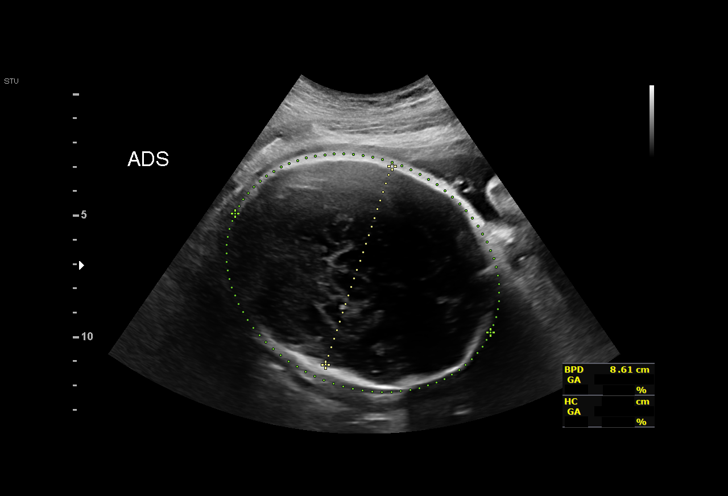
[im 14/34]
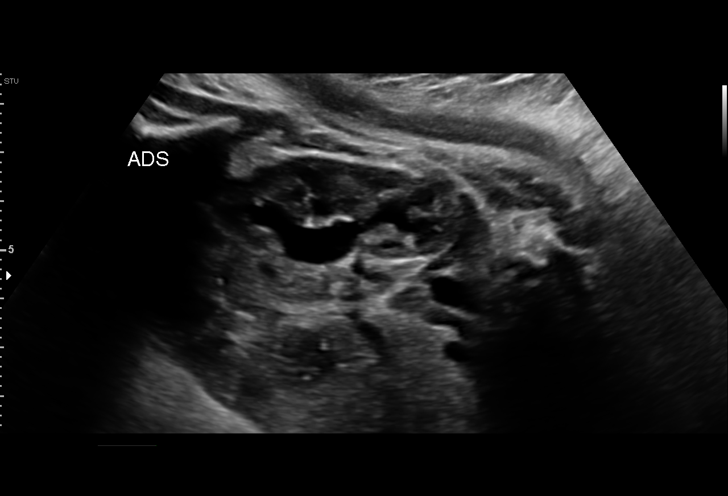
[im 16/34]
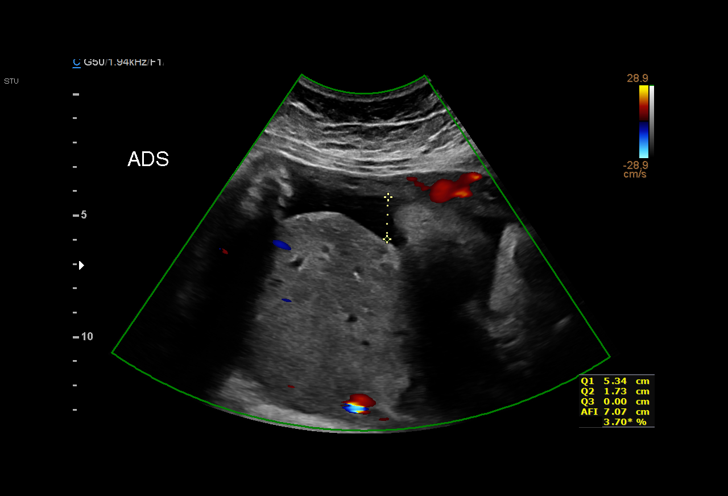
[im 19/34]
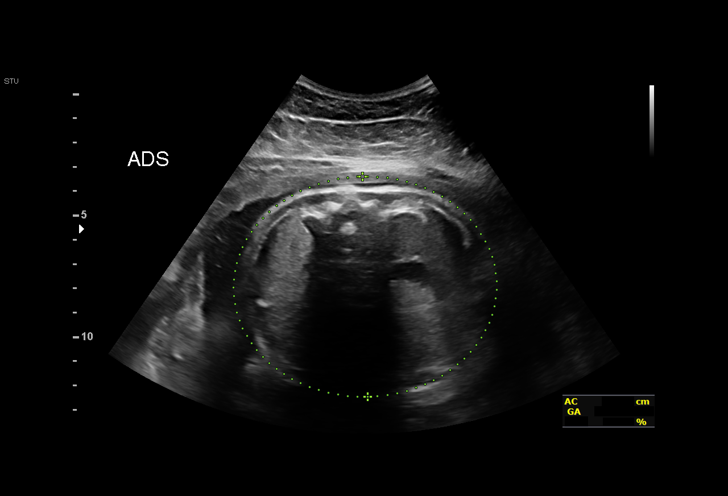
[im 21/34]
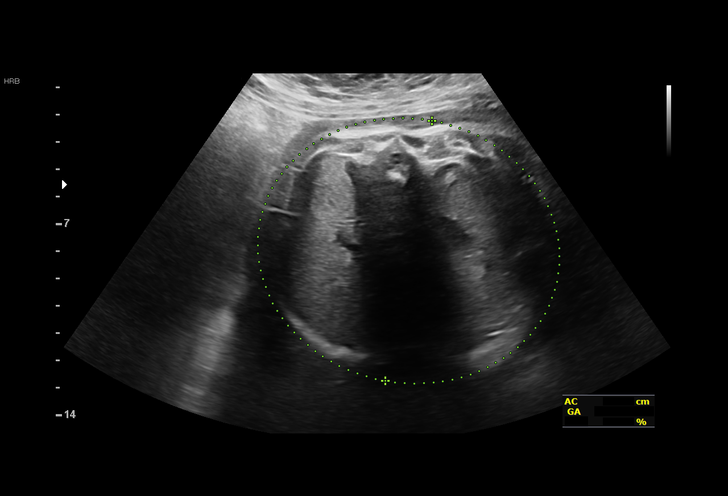
[im 24/34]
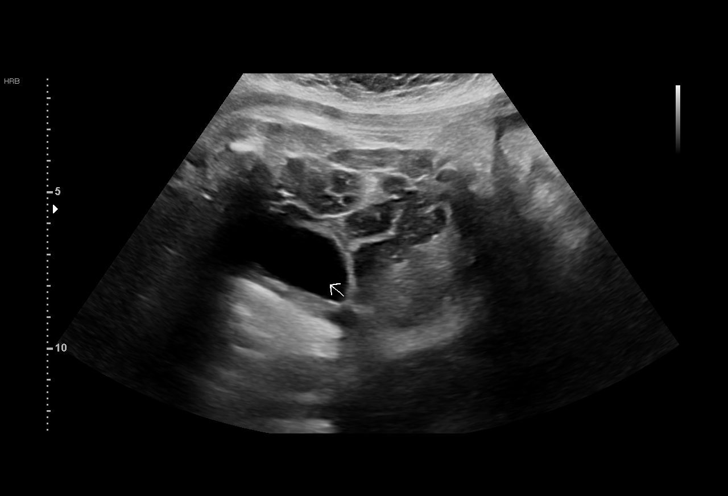
[im 26/34]
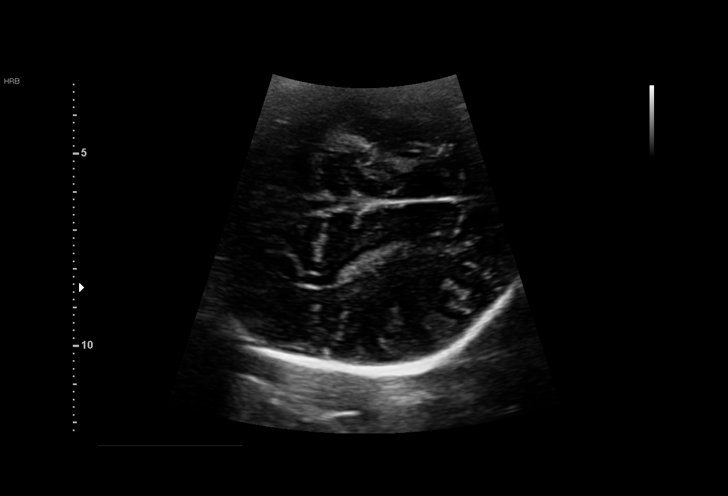
[im 29/34]
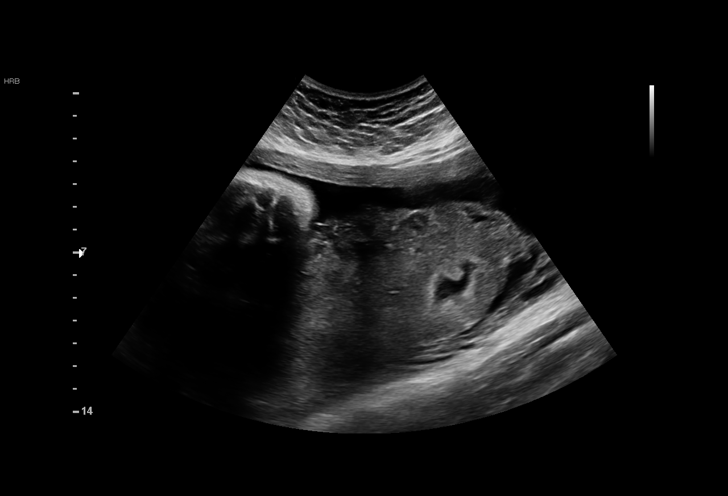
[im 31/34]
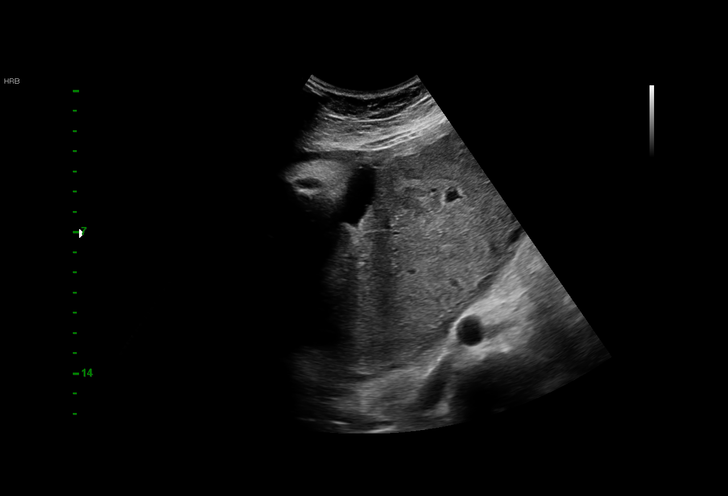
[im 34/34]
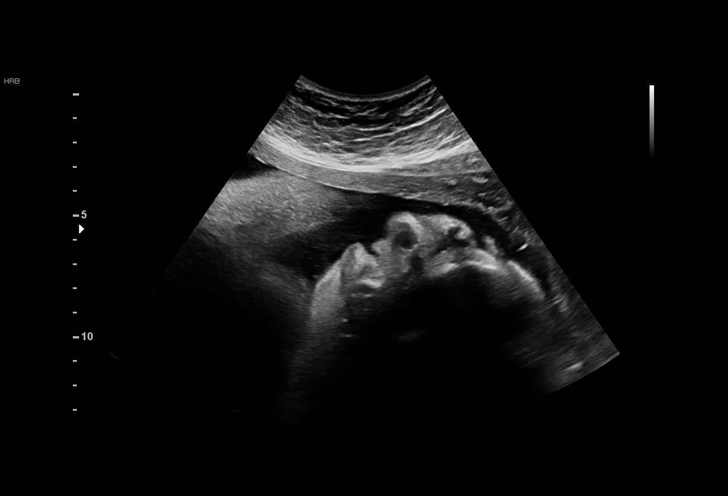

[14 of 28 positions shown; findings below may reference images not displayed]

Road [HOSPITAL]

1  YURIKO OROURKE           [id number]      [id number]     [id number]
Indications

36 weeks gestation of pregnancy
Pre-existing essential hypertension
complicating pregnancy, third trimester
Obesity complicating pregnancy, third
trimester
Pyelectasis of fetus on prenatal ultrasound
OB History

Gravidity:    2         Term:   1        Prem:   0        SAB:   0
TOP:          0       Ectopic:  0        Living: 1
Fetal Evaluation

Num Of Fetuses:     1
Fetal Heart         118
Rate(bpm):
Cardiac Activity:   Observed
Presentation:       Cephalic
Placenta:           Posterior, above cervical os
P. Cord Insertion:  Previously Visualized

Amniotic Fluid
AFI FV:      Subjectively within normal limits

AFI Sum(cm)     %Tile       Largest Pocket(cm)
12.08           38

RUQ(cm)       RLQ(cm)       LUQ(cm)        LLQ(cm)
5.34
Biometry

BPD:      88.1  mm     G. Age:  35w 4d         37  %    CI:        71.08   %    70 - 86
FL/HC:       18.4  %    20.1 -
HC:      332.9  mm     G. Age:  38w 0d         60  %    HC/AC:       1.03       0.93 -
AC:      324.7  mm     G. Age:  36w 3d         60  %    FL/BPD:      69.5  %    71 - 87
FL:       61.2  mm     G. Age:  31w 5d        < 3  %    FL/AC:       18.8  %    20 - 24
HUM:      56.6  mm     G. Age:  32w 6d        < 5  %

Est. FW:    5273   gm    5 lb 13 oz     39  %
Gestational Age

U/S Today:     35w 3d                                        EDD:   01/21/18
Best:          36w 3d     Det. By:  Early Ultrasound         EDD:   01/14/18
(05/19/17)
Anatomy

Cranium:               Appears normal         Aortic Arch:            Previously seen
Cavum:                 Previously seen        Ductal Arch:            Previously seen
Ventricles:            Appears normal         Diaphragm:              Appears normal
Choroid Plexus:        Previously seen        Stomach:                Appears normal, left
sided
Cerebellum:            Previously seen        Abdomen:                Appears normal
Posterior Fossa:       Previously seen        Abdominal Wall:         Previously seen
Nuchal Fold:           Previously seen        Cord Vessels:           Previously seen
Face:                  Orbits and profile     Kidneys:                Left sided
previously seen
pyelectasis,
7.5mm
Lips:                  Previously seen        Bladder:                Appears normal
Thoracic:              Appears normal         Spine:                  Previously seen
Heart:                 Previously seen        Upper Extremities:      Previously seen
RVOT:                  Previously seen        Lower Extremities:      Previously seen
LVOT:                  Previously seen

Other:  Fetus appears to be a male. Open left hand previously visualized.
Right 5th digit previously visualized.
Cervix Uterus Adnexa

Cervix
Not visualized (advanced GA >85wks)
Impression

SIUP at 36+3 weeks
Cephalic presentation
Left UTD A1; pelvis measured 7.5 mms
All other interval fetal anatomy was seen and appeared
normal; anatomic survey complete
Normal amniotic fluid volume
Appropriate interval growth with EFW at the 39th %tile
Recommendations

Consider evaluation of newborn's kidneys
Continue antenatal testing.
Recommend delivery by 39 weeks if maternal and fetal status
remain reassuring.

## 2019-09-26 ENCOUNTER — Other Ambulatory Visit: Payer: Self-pay | Admitting: Obstetrics

## 2019-09-26 DIAGNOSIS — Z30011 Encounter for initial prescription of contraceptive pills: Secondary | ICD-10-CM

## 2020-01-05 ENCOUNTER — Other Ambulatory Visit: Payer: Self-pay | Admitting: Obstetrics

## 2020-01-05 DIAGNOSIS — Z30011 Encounter for initial prescription of contraceptive pills: Secondary | ICD-10-CM

## 2020-04-14 ENCOUNTER — Other Ambulatory Visit: Payer: Self-pay | Admitting: Obstetrics

## 2020-04-14 DIAGNOSIS — Z30011 Encounter for initial prescription of contraceptive pills: Secondary | ICD-10-CM

## 2020-05-09 ENCOUNTER — Telehealth: Payer: Medicaid Other | Admitting: Nurse Practitioner

## 2020-05-09 DIAGNOSIS — R197 Diarrhea, unspecified: Secondary | ICD-10-CM

## 2020-05-09 NOTE — Progress Notes (Signed)
Based on what you shared with me it looks like you have diarrhea with abdominal pain that has lasted greater then 5 days,that should be evaluated in a face to face office visit. You need a face to face visit to have stool tested. You have had for to many days to treat in an evisit.    NOTE: If you entered your credit card information for this eVisit, you will not be charged. You may see a "hold" on your card for the $35 but that hold will drop off and you will not have a charge processed.  If you are having a true medical emergency please call 911.     For an urgent face to face visit, [facility] has four urgent care centers for your convenience:   . [facility]    [phone]                  Get Driving Directions  [address], [place] [address] . 10 am to 8 pm Monday-Friday . 12 pm to 8 pm Saturday-Sunday   . [facility] at [place]                  Get Driving Directions  [address], [place], [place] [address] . 8 am to 8 pm Monday-Friday . 9 am to 6 pm Saturday . 11 am to 6 pm Sunday   . [facility] at [place]                  Get Driving Directions   [address] [place], [place] [address] . 8 am to 8 pm Monday-Friday . 8 am to 4 pm Saturday-Sunday    . [facility] at [place]                    Get Driving Directions  [phone]  [address], [place],  [address]  . Monday-Friday, 12 PM to 6 PM    Your e-visit answers were reviewed by a board certified advanced clinical practitioner to complete your personal care plan.  Thank you for using e-Visits.

## 2020-07-10 ENCOUNTER — Other Ambulatory Visit: Payer: Self-pay | Admitting: Obstetrics

## 2020-07-10 DIAGNOSIS — Z30011 Encounter for initial prescription of contraceptive pills: Secondary | ICD-10-CM

## 2021-09-14 DIAGNOSIS — F419 Anxiety disorder, unspecified: Secondary | ICD-10-CM

## 2021-09-14 HISTORY — DX: Anxiety disorder, unspecified: F41.9

## 2021-10-30 DIAGNOSIS — Z03818 Encounter for observation for suspected exposure to other biological agents ruled out: Secondary | ICD-10-CM | POA: Diagnosis not present

## 2021-10-30 DIAGNOSIS — Z20822 Contact with and (suspected) exposure to covid-19: Secondary | ICD-10-CM | POA: Diagnosis not present

## 2021-12-10 ENCOUNTER — Ambulatory Visit (INDEPENDENT_AMBULATORY_CARE_PROVIDER_SITE_OTHER): Payer: Medicaid Other | Admitting: Family Medicine

## 2021-12-10 ENCOUNTER — Encounter: Payer: Self-pay | Admitting: Family Medicine

## 2021-12-10 ENCOUNTER — Telehealth: Payer: Self-pay | Admitting: Emergency Medicine

## 2021-12-10 ENCOUNTER — Other Ambulatory Visit (HOSPITAL_COMMUNITY)
Admission: RE | Admit: 2021-12-10 | Discharge: 2021-12-10 | Disposition: A | Discharge status: Home / Self Care | Payer: Medicaid Other | Source: Ambulatory Visit | Attending: Family Medicine | Admitting: Family Medicine

## 2021-12-10 VITALS — BP 121/81 | HR 79 | Ht 65.0 in | Wt 227.5 lb

## 2021-12-10 DIAGNOSIS — N644 Mastodynia: Secondary | ICD-10-CM

## 2021-12-10 DIAGNOSIS — Z3009 Encounter for other general counseling and advice on contraception: Secondary | ICD-10-CM

## 2021-12-10 DIAGNOSIS — G8929 Other chronic pain: Secondary | ICD-10-CM

## 2021-12-10 DIAGNOSIS — N898 Other specified noninflammatory disorders of vagina: Secondary | ICD-10-CM | POA: Insufficient documentation

## 2021-12-10 DIAGNOSIS — N943 Premenstrual tension syndrome: Secondary | ICD-10-CM

## 2021-12-10 DIAGNOSIS — Z124 Encounter for screening for malignant neoplasm of cervix: Secondary | ICD-10-CM | POA: Diagnosis not present

## 2021-12-10 NOTE — Telephone Encounter (Signed)
TC from pt to nurse line. Pt has apt scheduled today for pap smear and states that she is currently on her menstrual cycle. Pt states that her bleeding isn't heavy.  ?Pt informed that she still may present for appointment today.   ?

## 2021-12-10 NOTE — Progress Notes (Signed)
Patient presents for AEX. Patient complains of having vaginal discharge and odor. Denies having any irritation at this time. Patient would like to be checked for yeast and bv, but not STDs. Patient states that she has a new mole on her left breast and states that both of her breast has been tender consistently. Denies having any family hx of breast cancer that she is aware of. Patient would like to discuss having a hysterectomy instead of a tubal ligation or birth control. ? ?Last pap: 2018 Normal ?

## 2021-12-10 NOTE — Progress Notes (Signed)
? ? ?GYNECOLOGY ANNUAL PREVENTATIVE CARE ENCOUNTER NOTE ? ?History:    ? Amber Dominguez is a 31 y.o. [id number] female here for a routine annual gynecologic exam.  Current complaints: desires hysterectomy and bilateral breast soreness.    ? ?She would like a hysterectomy to avoid any further periods. Does not describe as heavy or prolonged. Reports she is also exhausted at her increased irritability, moodiness, and extreme fatigue during/right before her cycle. Feels like this is impacting her quality of life with people close to her noticing as well. She reports a normal mood in between this time.  ? ?Endorses bilateral diffuse breast soreness constantly since last birth in 2019. Worse when she is on her cycle. Wears good support.  ? ?Currently on her menstrual cycle.  ?  ?Gynecologic History ?Patient's last menstrual period was 12/09/2021. ?Contraception: none currently.  ?Last Pap: 10-/2018. Results were: normal  ?No family history of breast cancer. ? ?Obstetric History ?OB History  ?Gravida Para Term Preterm AB Living  ?'2 2 2     2  '$ ?SAB IAB Ectopic Multiple Live Births  ?      0 2  ?  ?# Outcome Date GA Lbr Len/2nd Weight Sex Delivery Anes PTL Lv  ?2 Term 01/04/18 [id number]:48 / 00:13 6 lb 5.6 oz (2.88 kg) M Vag-Spont EPI  LIV  ?1 Term 09/05/12 [redacted]w[redacted]d M Vag-Spont   LIV  ? ? ?Past Medical History:  ?Diagnosis Date  ? Chlamydia   ? Headache   ? Hypertension   ? ? ?Past Surgical History:  ?Procedure Laterality Date  ? TONSILLECTOMY    ? WISDOM TOOTH EXTRACTION    ? ? ?Current Outpatient Medications on File Prior to Visit  ?Medication Sig Dispense Refill  ? acetaminophen (TYLENOL) 500 MG tablet Take 1,000 mg by mouth every 6 (six) hours as needed for mild pain or headache.    ? ibuprofen (ADVIL,MOTRIN) 600 MG tablet Take 1 tablet (600 mg total) by mouth every 6 (six) hours. 30 tablet 0  ? ERRIN 0.35 MG tablet TAKE 1 TABLET BY MOUTH EVERY DAY (Patient not taking: Reported on 12/10/2021) 84 tablet 3  ? ?No current  facility-administered medications on file prior to visit.  ? ? ?Allergies  ?Allergen Reactions  ? Cefzil [Cefprozil] Hives  ?  Coughing, fever  ? ? ?Social History:  reports that she has never smoked. She has never used smokeless tobacco. She reports current alcohol use. She reports that she does not use drugs. ? ?Family History  ?Problem Relation Age of Onset  ? Rheum arthritis Mother   ? Heart disease Father   ? ? ?The following portions of the patient's history were reviewed and updated as appropriate: allergies, current medications, past family history, past medical history, past social history, past surgical history and problem list. ? ?Review of Systems ?Pertinent items noted in HPI and remainder of comprehensive ROS otherwise negative. ? ?Physical Exam:  ?BP 121/81   Pulse 79   Ht '5\' 5"'$  (1.651 m)   Wt 227 lb 8 oz (103.2 kg)   LMP 12/09/2021   BMI 37.86 kg/m?  ?CONSTITUTIONAL: Well-developed, well-nourished female in no acute distress.  ?HENT:  Normocephalic, atraumatic. Oropharynx is clear and moist ?EYES: Conjunctivae and EOM are normal.  ?NECK: Normal range of motion, supple, no masses.  Normal thyroid.  ?SKIN: Skin is warm and dry. No rash noted.  ?MUSCULOSKELETAL: Normal range of motion. No tenderness.   ?NEUROLOGIC: Alert and oriented to person,  place, and time.  ?PSYCHIATRIC: Normal mood and affect. Normal behavior. Normal judgment and thought content. ?CARDIOVASCULAR: Normal heart rate noted ?RESPIRATORY: Effort normal ?BREASTS: Symmetric in size. No masses, tenderness, skin changes, nipple drainage, or lymphadenopathy bilaterally. Small 23m circular mole present on the left breast around 6 o'clock position that is non-tender to palpation. Performed in the presence of a chaperone. ?ABDOMEN: Soft, no distention noted.  No tenderness. ?PELVIC: Normal appearing external genitalia and urethral meatus; normal appearing vaginal mucosa and cervix. Small amount of blood pooling in vaginal vault. Thin  layer of mucus and blood covering cervical os, attempted to remove with fox swabs prior to pap collection. Pap smear obtained.  Normal uterine size, no other palpable masses, no uterine or adnexal tenderness.  Performed in the presence of a chaperone. ?  ?Assessment and Plan:  ? ?1. Encounter for Papanicolaou smear of cervix ?Normal pelvic, but difficulty obtaining pap with mucus/blood obstructing os. Hopeful that enough of transformation zone was able to be collected due to this.  ?- Cytology - PAP ? ?2. Vaginal discharge ?- Cervicovaginal ancillary only( [facility] ? ?3. Encounter for counseling regarding contraception ?Reviewed different types of birth control available: OCPs, combined patch, vaginal ring, Nexplanon, Depo, various types of IUDs, permanent sterilization. Discussed that this unfortunately is not an indication for a hysterectomy.  ? ?4. Chronic breast pain ?Bilateral and diffuse with reassuring breast exam. Encouraged good support on consistent basis. Hopeful OCPs as below may improve symptoms.   ? ?5. Pre-menstrual syndrome vs PMDD  ?Impacting qualify of life during this time. Discussed OCPs vs SSRI (continuous vs cyclic), as she would like contraception currently, would like to do OCPs to see if this improves.  ? ?Will follow up results of pap smear and manage accordingly. ? ?Follow up in the next few months for above or sooner if needed.  ?   ?SDarrelyn Hillock DO  ?OB Fellow, Faculty Practice ?[place] ?12/10/2021 4:22 PM ? ?

## 2021-12-11 LAB — CERVICOVAGINAL ANCILLARY ONLY
Bacterial Vaginitis (gardnerella): NEGATIVE
Candida Glabrata: POSITIVE — AB
Candida Vaginitis: NEGATIVE
Comment: NEGATIVE
Comment: NEGATIVE
Comment: NEGATIVE

## 2021-12-12 ENCOUNTER — Encounter: Payer: Self-pay | Admitting: Family Medicine

## 2021-12-15 LAB — CYTOLOGY - PAP
Comment: NEGATIVE
Diagnosis: NEGATIVE
High risk HPV: NEGATIVE

## 2021-12-18 ENCOUNTER — Other Ambulatory Visit: Payer: Self-pay | Admitting: Family Medicine

## 2021-12-18 DIAGNOSIS — B379 Candidiasis, unspecified: Secondary | ICD-10-CM

## 2021-12-18 MED ORDER — FLUCONAZOLE 150 MG PO TABS: 150.0000 mg | ORAL_TABLET | ORAL | 0 refills | Status: DC

## 2021-12-22 ENCOUNTER — Other Ambulatory Visit: Payer: Self-pay | Admitting: Family Medicine

## 2021-12-22 DIAGNOSIS — Z30011 Encounter for initial prescription of contraceptive pills: Secondary | ICD-10-CM

## 2021-12-22 MED ORDER — NORGESTIMATE-ETH ESTRADIOL 0.25-35 MG-MCG PO TABS: 1.0000 | ORAL_TABLET | Freq: Every day | ORAL | 3 refills | Status: DC

## 2022-02-24 ENCOUNTER — Encounter: Payer: Self-pay | Admitting: Nurse Practitioner

## 2022-02-24 ENCOUNTER — Ambulatory Visit (INDEPENDENT_AMBULATORY_CARE_PROVIDER_SITE_OTHER): Payer: BC Managed Care – PPO | Admitting: Nurse Practitioner

## 2022-02-24 VITALS — BP 120/72 | HR 74 | Temp 97.0°F | Ht 64.96 in | Wt 223.0 lb

## 2022-02-24 DIAGNOSIS — E559 Vitamin D deficiency, unspecified: Secondary | ICD-10-CM

## 2022-02-24 DIAGNOSIS — Z7689 Persons encountering health services in other specified circumstances: Secondary | ICD-10-CM | POA: Diagnosis not present

## 2022-02-24 DIAGNOSIS — Z0000 Encounter for general adult medical examination without abnormal findings: Secondary | ICD-10-CM

## 2022-02-24 DIAGNOSIS — R10817 Generalized abdominal tenderness: Secondary | ICD-10-CM

## 2022-02-24 DIAGNOSIS — K580 Irritable bowel syndrome with diarrhea: Secondary | ICD-10-CM

## 2022-02-24 DIAGNOSIS — Z6837 Body mass index (BMI) 37.0-37.9, adult: Secondary | ICD-10-CM | POA: Diagnosis not present

## 2022-02-24 MED ORDER — DICYCLOMINE HCL 20 MG PO TABS: 20.0000 mg | ORAL_TABLET | Freq: Three times a day (TID) | ORAL | 1 refills | Status: DC | PRN

## 2022-02-24 NOTE — Progress Notes (Signed)
New Patient Office Visit  Subjective    Patient ID: Amber Dominguez, female    DOB: 17-Jan-1991  Age: 31 y.o. MRN: [id number]  CC:  Chief Complaint  Patient presents with   New Patient (Initial Visit)    HPI Amber Dominguez presents to establish care She states that she has mainly seen only OB/GYN for most of her healthcare needs.  -having cramping and diarrhea. Was starting the week before her period. Now happening more often. Feels like she has to have a bowel movement every hour after she eats. Can last for several hours or for the rest of the day.  -does admit to nausea.  -denies vomiting.  -cannot pinpoint foods that make it worse.  -pain is mostly in left upper and lower parts of the abdomen.    Outpatient Encounter Medications as of 02/24/2022  Medication Sig   acetaminophen (TYLENOL) 500 MG tablet Take 1,000 mg by mouth every 6 (six) hours as needed for mild pain or headache.   dicyclomine (BENTYL) 20 MG tablet Take 1 tablet (20 mg total) by mouth 3 (three) times daily as needed for spasms.   fluconazole (DIFLUCAN) 150 MG tablet Take 1 tablet (150 mg total) by mouth every 3 (three) days. Take one tab, if not improved, take second pill 72 hours later   ibuprofen (ADVIL,MOTRIN) 600 MG tablet Take 1 tablet (600 mg total) by mouth every 6 (six) hours.   norgestimate-ethinyl estradiol (SPRINTEC 28) 0.25-35 MG-MCG tablet Take 1 tablet by mouth daily.   No facility-administered encounter medications on file as of 02/24/2022.    Past Medical History:  Diagnosis Date   Chlamydia    Chronic hypertension complicating pregnancy, antepartum 06/28/2017   [ X] Aspirin 81 mg daily after 12 weeks; discontinue after 36 weeks Current antihypertensives:  None   Baseline and surveillance labs (pulled in from [facility], refresh links as needed)  Lab Results  Component Value Date   PLT 246 05/19/2017    Antenatal Testing CHTN - O10.919  Group I  BP < 140/90, no preeclampsia, AGA,  nml AFV, +/-  meds    Group II BP > 140/90, on meds, no preeclampsia, AGA, nml AFV   Headache    Hypertension     Past Surgical History:  Procedure Laterality Date   TONSILLECTOMY     WISDOM TOOTH EXTRACTION      Family History  Problem Relation Age of Onset   Rheum arthritis Mother    Heart disease Father     Social History   Socioeconomic History   Marital status: Significant Other    Spouse name: Not on file   Number of children: Not on file   Years of education: Not on file   Highest education level: Not on file  Occupational History   Not on file  Tobacco Use   Smoking status: Never   Smokeless tobacco: Never  Vaping Use   Vaping Use: Never used  Substance and Sexual Activity   Alcohol use: Yes    Comment: rarely   Drug use: No   Sexual activity: Yes    Birth control/protection: None  Other Topics Concern   Not on file  Social History Narrative   Not on file   Social Determinants of Health   Financial Resource Strain: Not on file  Food Insecurity: Not on file  Transportation Needs: Not on file  Physical Activity: Not on file  Stress: Not on file  Social Connections: Not on file  Intimate  Partner Violence: Not on file    Review of Systems  Constitutional:  Positive for malaise/fatigue. Negative for chills and fever.       Appetite decrease   HENT:  Negative for congestion, sinus pain and sore throat.   Eyes: Negative.   Respiratory:  Negative for cough, shortness of breath and wheezing.   Cardiovascular:  Negative for chest pain, palpitations and leg swelling.  Gastrointestinal:  Positive for abdominal pain, diarrhea and nausea. Negative for vomiting.       Symptoms are worse after eating.   Genitourinary: Negative.   Musculoskeletal:  Negative for myalgias.  Skin: Negative.   Neurological:  Negative for dizziness and headaches.  Endo/Heme/Allergies:  Does not bruise/bleed easily.  Psychiatric/Behavioral:  Negative for depression. The patient is not  nervous/anxious.         Objective    Today's Vitals   02/24/22 1608  BP: 120/72  Pulse: 74  Temp: (!) 97 F (36.1 C)  SpO2: 100%  Weight: 223 lb (101.2 kg)  Height: 5' 4.96" (1.65 m)   Body mass index is 37.15 kg/m.   Physical Exam Vitals and nursing note reviewed.  Constitutional:      Appearance: Normal appearance. She is well-developed. She is ill-appearing.  HENT:     Head: Normocephalic and atraumatic.  Eyes:     General: Scleral icterus: vitamin d.     Pupils: Pupils are equal, round, and reactive to light.  Cardiovascular:     Rate and Rhythm: Normal rate and regular rhythm.     Pulses: Normal pulses.     Heart sounds: Normal heart sounds.  Pulmonary:     Effort: Pulmonary effort is normal.     Breath sounds: Normal breath sounds.  Abdominal:     General: Bowel sounds are normal. There is no distension.     Palpations: Abdomen is soft. There is no mass.     Tenderness: There is abdominal tenderness. There is no right CVA tenderness, left CVA tenderness, guarding or rebound.     Hernia: No hernia is present.  Musculoskeletal:        General: Normal range of motion.     Cervical back: Normal range of motion and neck supple.  Lymphadenopathy:     Cervical: No cervical adenopathy.  Skin:    General: Skin is warm and dry.     Capillary Refill: Capillary refill takes less than 2 seconds.  Neurological:     General: No focal deficit present.     Mental Status: She is alert and oriented to person, place, and time.  Psychiatric:        Mood and Affect: Mood normal.        Behavior: Behavior normal.        Thought Content: Thought content normal.        Judgment: Judgment normal.       Assessment & Plan:  1. Irritable bowel syndrome with diarrhea Trial dicyclomine 20 mg tablets.  May take up to 3 times daily to help reduce intestinal cramping and diarrhea.  We will get labs to send for basic food allergies and celiac disease screening. - dicyclomine  (BENTYL) 20 MG tablet; Take 1 tablet (20 mg total) by mouth 3 (three) times daily as needed for spasms.  Dispense: 45 tablet; Refill: 1  2. BMI 37.0-37.9, adult Discussed lowering calorie intake to 1500 calories per day and incorporating exercise into daily routine to help lose weight.   3. Encounter to establish  care Appointment today to establish new primary care provider     Problem List Items Addressed This Visit       Digestive   Irritable bowel syndrome with diarrhea - Primary   Relevant Medications   dicyclomine (BENTYL) 20 MG tablet     Other   BMI 37.0-37.9, adult   Other Visit Diagnoses     Encounter to establish care           Return in about 2 weeks (around 03/10/2022) for abdominal pain/diarrhea - labs needed in next week. routine fasting labs plus food.   Ronnell Freshwater, NP

## 2022-03-01 DIAGNOSIS — Z6837 Body mass index (BMI) 37.0-37.9, adult: Secondary | ICD-10-CM | POA: Insufficient documentation

## 2022-03-01 DIAGNOSIS — K580 Irritable bowel syndrome with diarrhea: Secondary | ICD-10-CM | POA: Insufficient documentation

## 2022-03-02 ENCOUNTER — Other Ambulatory Visit: Payer: Self-pay | Admitting: Nurse Practitioner

## 2022-03-02 ENCOUNTER — Other Ambulatory Visit: Payer: BC Managed Care – PPO

## 2022-03-02 DIAGNOSIS — E559 Vitamin D deficiency, unspecified: Secondary | ICD-10-CM

## 2022-03-02 DIAGNOSIS — Z6837 Body mass index (BMI) 37.0-37.9, adult: Secondary | ICD-10-CM | POA: Diagnosis not present

## 2022-03-02 DIAGNOSIS — Z0000 Encounter for general adult medical examination without abnormal findings: Secondary | ICD-10-CM | POA: Diagnosis not present

## 2022-03-02 DIAGNOSIS — K580 Irritable bowel syndrome with diarrhea: Secondary | ICD-10-CM

## 2022-03-02 DIAGNOSIS — R10817 Generalized abdominal tenderness: Secondary | ICD-10-CM | POA: Diagnosis not present

## 2022-03-03 ENCOUNTER — Other Ambulatory Visit: Payer: BC Managed Care – PPO

## 2022-03-03 DIAGNOSIS — Z0000 Encounter for general adult medical examination without abnormal findings: Secondary | ICD-10-CM | POA: Diagnosis not present

## 2022-03-03 DIAGNOSIS — E559 Vitamin D deficiency, unspecified: Secondary | ICD-10-CM | POA: Diagnosis not present

## 2022-03-03 DIAGNOSIS — R10817 Generalized abdominal tenderness: Secondary | ICD-10-CM | POA: Diagnosis not present

## 2022-03-03 DIAGNOSIS — K580 Irritable bowel syndrome with diarrhea: Secondary | ICD-10-CM | POA: Diagnosis not present

## 2022-03-03 LAB — COMPREHENSIVE METABOLIC PANEL WITH GFR
ALT: 14 IU/L (ref 0–32)
AST: 15 IU/L (ref 0–40)
Albumin/Globulin Ratio: 1.5 (ref 1.2–2.2)
Albumin: 4.3 g/dL (ref 3.8–4.8)
Alkaline Phosphatase: 72 IU/L (ref 44–121)
BUN/Creatinine Ratio: 12 (ref 9–23)
BUN: 10 mg/dL (ref 6–20)
Bilirubin Total: 0.2 mg/dL (ref 0.0–1.2)
CO2: 20 mmol/L (ref 20–29)
Calcium: 9.7 mg/dL (ref 8.7–10.2)
Chloride: 105 mmol/L (ref 96–106)
Creatinine, Ser: 0.86 mg/dL (ref 0.57–1.00)
Globulin, Total: 2.9 g/dL (ref 1.5–4.5)
Glucose: 87 mg/dL (ref 70–99)
Potassium: 4.1 mmol/L (ref 3.5–5.2)
Sodium: 141 mmol/L (ref 134–144)
Total Protein: 7.2 g/dL (ref 6.0–8.5)
eGFR: 93 mL/min/1.73 (ref >59)

## 2022-03-03 LAB — LIPID PANEL
Chol/HDL Ratio: 3.1 ratio (ref 0.0–4.4)
Cholesterol, Total: 148 mg/dL (ref 100–199)
HDL: 47 mg/dL (ref >39)
LDL Chol Calc (NIH): 70 mg/dL (ref 0–99)
Triglycerides: 187 mg/dL — ABNORMAL HIGH (ref 0–149)
VLDL Cholesterol Cal: 31 mg/dL (ref 5–40)

## 2022-03-03 LAB — TSH: TSH: 1.47 u[IU]/mL (ref 0.450–4.500)

## 2022-03-03 LAB — VITAMIN D 25 HYDROXY (VIT D DEFICIENCY, FRACTURES): Vit D, 25-Hydroxy: 39.8 ng/mL (ref 30.0–100.0)

## 2022-03-03 LAB — T4, FREE: Free T4: 1.17 ng/dL (ref 0.82–1.77)

## 2022-03-04 NOTE — Progress Notes (Signed)
Very mild elevation of triglycerides with other labs looking good so far. Review with patient during visit 03/10/2022.

## 2022-03-04 NOTE — Progress Notes (Signed)
Waiting ofr all all results.

## 2022-03-05 ENCOUNTER — Other Ambulatory Visit: Payer: Self-pay | Admitting: Nurse Practitioner

## 2022-03-05 DIAGNOSIS — K580 Irritable bowel syndrome with diarrhea: Secondary | ICD-10-CM

## 2022-03-05 LAB — FOOD ALLERGY PROFILE
Allergen Corn, IgE: <0.1 kU/L
Clam IgE: <0.1 kU/L
Codfish IgE: <0.1 kU/L
Egg White IgE: <0.1 kU/L
Milk IgE: <0.1 kU/L
Peanut IgE: <0.1 kU/L
Scallop IgE: <0.1 kU/L
Sesame Seed IgE: 0.36 kU/L — AB
Shrimp IgE: 0.17 kU/L — AB
Soybean IgE: <0.1 kU/L
Walnut IgE: <0.1 kU/L
Wheat IgE: <0.1 kU/L

## 2022-03-05 LAB — CELIAC AB TTG DGP TIGA
Antigliadin Abs, IgA: 4 units (ref 0–19)
Gliadin IgG: 3 units (ref 0–19)
IgA/Immunoglobulin A, Serum: 140 mg/dL (ref 87–352)
Tissue Transglut Ab: 3 U/mL (ref 0–5)
Transglutaminase IgA: <2 U/mL (ref 0–3)

## 2022-03-05 LAB — CBC WITH DIFFERENTIAL/PLATELET
Basophils Absolute: 0 10*3/uL (ref 0.0–0.2)
Basos: 0 %
EOS (ABSOLUTE): 0.1 10*3/uL (ref 0.0–0.4)
Eos: 1 %
Hematocrit: 39.2 % (ref 34.0–46.6)
Hemoglobin: 13.1 g/dL (ref 11.1–15.9)
Immature Grans (Abs): 0 10*3/uL (ref 0.0–0.1)
Immature Granulocytes: 0 %
Lymphocytes Absolute: 1.9 10*3/uL (ref 0.7–3.1)
Lymphs: 30 %
MCH: 29.5 pg (ref 26.6–33.0)
MCHC: 33.4 g/dL (ref 31.5–35.7)
MCV: 88 fL (ref 79–97)
Monocytes Absolute: 0.4 10*3/uL (ref 0.1–0.9)
Monocytes: 6 %
Neutrophils Absolute: 4.1 10*3/uL (ref 1.4–7.0)
Neutrophils: 63 %
Platelets: 264 10*3/uL (ref 150–450)
RBC: 4.44 x10E6/uL (ref 3.77–5.28)
RDW: 11.8 % (ref 11.7–15.4)
WBC: 6.6 10*3/uL (ref 3.4–10.8)

## 2022-03-05 LAB — LIPASE: Lipase: 31 U/L (ref 14–72)

## 2022-03-05 LAB — AMYLASE: Amylase: 56 U/L (ref 31–110)

## 2022-03-05 LAB — HEMOGLOBIN A1C
Est. average glucose Bld gHb Est-mCnc: 103 mg/dL
Hgb A1c MFr Bld: 5.2 % (ref 4.8–5.6)

## 2022-03-05 NOTE — Telephone Encounter (Signed)
This is OK to refill. She has f/u 6/27

## 2022-03-10 ENCOUNTER — Other Ambulatory Visit: Payer: BC Managed Care – PPO

## 2022-03-10 ENCOUNTER — Encounter: Payer: Self-pay | Admitting: Nurse Practitioner

## 2022-03-10 ENCOUNTER — Ambulatory Visit (INDEPENDENT_AMBULATORY_CARE_PROVIDER_SITE_OTHER): Payer: BC Managed Care – PPO | Admitting: Nurse Practitioner

## 2022-03-10 VITALS — BP 125/81 | HR 68 | Temp 98.0°F | Ht 64.96 in | Wt 221.5 lb

## 2022-03-10 DIAGNOSIS — F3281 Premenstrual dysphoric disorder: Secondary | ICD-10-CM | POA: Diagnosis not present

## 2022-03-10 DIAGNOSIS — R1013 Epigastric pain: Secondary | ICD-10-CM | POA: Diagnosis not present

## 2022-03-10 DIAGNOSIS — Z6836 Body mass index (BMI) 36.0-36.9, adult: Secondary | ICD-10-CM | POA: Diagnosis not present

## 2022-03-10 DIAGNOSIS — K580 Irritable bowel syndrome with diarrhea: Secondary | ICD-10-CM

## 2022-03-10 MED ORDER — SERTRALINE HCL 25 MG PO TABS: 25.0000 mg | ORAL_TABLET | Freq: Every day | ORAL | 2 refills | Status: DC

## 2022-03-10 NOTE — Progress Notes (Signed)
Established patient visit   Patient: Amber Dominguez   DOB: 17-Jan-1991   30 y.o. Female  MRN: [id number] Visit Date: 03/10/2022   Chief Complaint  Patient presents with   Follow-up   Subjective    HPI  Follow up visit  -symptoms concerning for irritable bowel had been going on for some time.  -frequent bowel movements, especially after eating. Generally had intestinal cramps as well.  -labs done. Show mild allergy to shrimp and sesame seeds.  -negative Celiac disease screen.  -other labs normal.  -started dicyclomine as needed to help with cramping and diarrhea.  -consider ultrasound of abdomen and referral to GI    Medications: Outpatient Medications Prior to Visit  Medication Sig   acetaminophen (TYLENOL) 500 MG tablet Take 1,000 mg by mouth every 6 (six) hours as needed for mild pain or headache.   dicyclomine (BENTYL) 20 MG tablet TAKE 1 TABLET (20 MG TOTAL) BY MOUTH 3 (THREE) TIMES DAILY AS NEEDED FOR SPASMS.   fluconazole (DIFLUCAN) 150 MG tablet Take 1 tablet (150 mg total) by mouth every 3 (three) days. Take one tab, if not improved, take second pill 72 hours later   ibuprofen (ADVIL,MOTRIN) 600 MG tablet Take 1 tablet (600 mg total) by mouth every 6 (six) hours.   norgestimate-ethinyl estradiol (SPRINTEC 28) 0.25-35 MG-MCG tablet Take 1 tablet by mouth daily.   No facility-administered medications prior to visit.    Review of Systems  Constitutional:  Positive for fatigue. Negative for activity change, appetite change, chills and fever.  HENT:  Negative for congestion, postnasal drip, rhinorrhea, sinus pressure, sinus pain, sneezing and sore throat.   Eyes: Negative.   Respiratory:  Negative for cough, chest tightness, shortness of breath and wheezing.   Cardiovascular:  Negative for chest pain and palpitations.  Gastrointestinal:  Positive for abdominal distention, abdominal pain and diarrhea. Negative for constipation, nausea and vomiting.       Intestinal  cramping.   Endocrine: Negative for cold intolerance, heat intolerance, polydipsia and polyuria.  Genitourinary:  Negative for dyspareunia, dysuria, flank pain, frequency and urgency.  Musculoskeletal:  Negative for arthralgias, back pain and myalgias.  Skin:  Negative for rash.  Allergic/Immunologic: Negative for environmental allergies.  Neurological:  Negative for dizziness, weakness and headaches.  Hematological:  Negative for adenopathy.  Psychiatric/Behavioral:  Positive for dysphoric mood. The patient is nervous/anxious.        Symptoms are expecially bad around the time of her menstrual cycle.     Last CBC Lab Results  Component Value Date   WBC 6.6 03/03/2022   HGB 13.1 03/03/2022   HCT 39.2 03/03/2022   MCV 88 03/03/2022   MCH 29.5 03/03/2022   RDW 11.8 03/03/2022   PLT 264 64/40/3474   Last metabolic panel Lab Results  Component Value Date   GLUCOSE 87 03/02/2022   NA 141 03/02/2022   K 4.1 03/02/2022   CL 105 03/02/2022   CO2 20 03/02/2022   BUN 10 03/02/2022   CREATININE 0.86 03/02/2022   EGFR 93 03/02/2022   CALCIUM 9.7 03/02/2022   PROT 7.2 03/02/2022   ALBUMIN 4.3 03/02/2022   LABGLOB 2.9 03/02/2022   AGRATIO 1.5 03/02/2022   BILITOT 0.2 03/02/2022   ALKPHOS 72 03/02/2022   AST 15 03/02/2022   ALT 14 03/02/2022   ANIONGAP 11 01/04/2018   Last lipids Lab Results  Component Value Date   CHOL 148 03/02/2022   HDL 47 03/02/2022   LDLCALC 70 03/02/2022   TRIG  187 (H) 03/02/2022   CHOLHDL 3.1 03/02/2022   Last hemoglobin A1c Lab Results  Component Value Date   HGBA1C 5.2 03/03/2022   Last thyroid functions Lab Results  Component Value Date   TSH 1.470 03/02/2022   Last vitamin D Lab Results  Component Value Date   VD25OH 39.8 03/02/2022       Objective     Today's Vitals   03/10/22 1618  BP: 125/81  Pulse: 68  Temp: 98 F (36.7 C)  SpO2: 99%  Weight: 221 lb 8 oz (100.5 kg)  Height: 5' 4.96" (1.65 m)   Body mass index is  36.9 kg/m.   BP Readings from Last 3 Encounters:  03/10/22 125/81  02/24/22 120/72  12/10/21 121/81    Wt Readings from Last 3 Encounters:  03/10/22 221 lb 8 oz (100.5 kg)  02/24/22 223 lb (101.2 kg)  12/10/21 227 lb 8 oz (103.2 kg)    Physical Exam Vitals and nursing note reviewed.  Constitutional:      Appearance: Normal appearance. She is well-developed.  HENT:     Head: Normocephalic and atraumatic.  Eyes:     Pupils: Pupils are equal, round, and reactive to light.  Cardiovascular:     Rate and Rhythm: Normal rate and regular rhythm.     Pulses: Normal pulses.     Heart sounds: Normal heart sounds.  Pulmonary:     Effort: Pulmonary effort is normal.     Breath sounds: Normal breath sounds.  Abdominal:     General: Bowel sounds are normal. There is no distension.     Palpations: Abdomen is soft. There is no mass.     Tenderness: There is abdominal tenderness. There is no right CVA tenderness, left CVA tenderness, guarding or rebound.     Hernia: No hernia is present.  Musculoskeletal:        General: Normal range of motion.     Cervical back: Normal range of motion and neck supple.  Lymphadenopathy:     Cervical: No cervical adenopathy.  Skin:    General: Skin is warm and dry.     Capillary Refill: Capillary refill takes less than 2 seconds.  Neurological:     General: No focal deficit present.     Mental Status: She is alert and oriented to person, place, and time.  Psychiatric:        Attention and Perception: Attention and perception normal.        Mood and Affect: Mood is depressed. Affect is tearful.        Speech: Speech normal.        Behavior: Behavior normal. Behavior is cooperative.        Thought Content: Thought content normal.        Cognition and Memory: Cognition and memory normal.        Judgment: Judgment normal.       Assessment & Plan    1. 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   Patient understands to avoid consumption of these foods if possible.  We will get ultrasound of her abdomen for further evaluation.  Consider referral to GI. - US Abdomen Limited RUQ (LIVER/GB); Future  2. Irritable bowel syndrome with diarrhea Continue dicyclomine as prescribed and as needed.  Ultrasound of RUQ ordered for further evaluation.  Consider referral to GI for further evaluation. - US Abdomen Limited RUQ (LIVER/GB); Future  3. Premenstrual dysphoric disorder Start sertraline  25 mg tablets daily.  Reassess in 4 weeks. - sertraline (ZOLOFT) 25 MG tablet; Take 1 tablet (25 mg total) by mouth daily.  Dispense: 30 tablet; Refill: 2  4. BMI 36.0-36.9,adult Discussed lowering calorie intake to 1500 calories per day and incorporating exercise into daily routine to help lose weight.    Problem List Items Addressed This Visit       Digestive   Irritable bowel syndrome with diarrhea   Relevant Orders   US Abdomen Limited RUQ (LIVER/GB) (Completed)     Other   BMI 37.0-37.9, adult   Epigastric pain - Primary   Relevant Orders   US Abdomen Limited RUQ (LIVER/GB) (Completed)   Premenstrual dysphoric disorder   Relevant Medications   sertraline (ZOLOFT) 25 MG tablet     Return in about 4 weeks (around 04/07/2022) for review ultrasound , mood.         Ronnell Freshwater, NP  [facility] at [facility] [phone] (phone) [phone] (fax)  [place]

## 2022-03-13 ENCOUNTER — Ambulatory Visit
Admission: RE | Admit: 2022-03-13 | Discharge: 2022-03-13 | Disposition: A | Discharge status: Home / Self Care | Payer: BC Managed Care – PPO | Source: Ambulatory Visit | Attending: Nurse Practitioner | Admitting: Nurse Practitioner

## 2022-03-13 DIAGNOSIS — R197 Diarrhea, unspecified: Secondary | ICD-10-CM | POA: Diagnosis not present

## 2022-03-13 DIAGNOSIS — R1013 Epigastric pain: Secondary | ICD-10-CM | POA: Diagnosis not present

## 2022-03-13 DIAGNOSIS — K580 Irritable bowel syndrome with diarrhea: Secondary | ICD-10-CM

## 2022-03-13 DIAGNOSIS — K811 Chronic cholecystitis: Secondary | ICD-10-CM | POA: Diagnosis not present

## 2022-03-15 ENCOUNTER — Other Ambulatory Visit: Payer: Self-pay | Admitting: Nurse Practitioner

## 2022-03-15 DIAGNOSIS — K8012 Calculus of gallbladder with acute and chronic cholecystitis without obstruction: Secondary | ICD-10-CM

## 2022-03-15 DIAGNOSIS — R1013 Epigastric pain: Secondary | ICD-10-CM

## 2022-03-15 DIAGNOSIS — K580 Irritable bowel syndrome with diarrhea: Secondary | ICD-10-CM

## 2022-03-15 DIAGNOSIS — F3281 Premenstrual dysphoric disorder: Secondary | ICD-10-CM | POA: Insufficient documentation

## 2022-03-15 NOTE — Progress Notes (Signed)
Please let the patient know that ultrasound shows likely a contracted gallbladder with gallstones. There is also some fatty liver, which may be a results of the gallstones. I would like to refer her to see GI provider for further evaluation. I have placed this referral in Epic.  Thanks so much.   -HB

## 2022-03-15 NOTE — Progress Notes (Signed)
Referral placed to GI provider due to persistent abdominal pain. Ultrasound showing "wall echo shadow" complex which is most commonly seen with a contracted, stone filled gallbladder as in chronic   cholecystitis. Moderate hepatic steatosis.

## 2022-03-17 ENCOUNTER — Other Ambulatory Visit: Payer: Self-pay | Admitting: Nurse Practitioner

## 2022-03-17 DIAGNOSIS — K580 Irritable bowel syndrome with diarrhea: Secondary | ICD-10-CM

## 2022-04-01 ENCOUNTER — Other Ambulatory Visit: Payer: Self-pay | Admitting: Nurse Practitioner

## 2022-04-01 DIAGNOSIS — F3281 Premenstrual dysphoric disorder: Secondary | ICD-10-CM

## 2022-04-04 ENCOUNTER — Other Ambulatory Visit: Payer: Self-pay | Admitting: Family Medicine

## 2022-04-04 DIAGNOSIS — Z30011 Encounter for initial prescription of contraceptive pills: Secondary | ICD-10-CM

## 2022-04-07 ENCOUNTER — Ambulatory Visit: Payer: BC Managed Care – PPO | Admitting: Nurse Practitioner

## 2022-04-12 NOTE — Progress Notes (Deleted)
Established patient visit   Patient: Amber Dominguez   DOB: [birth year]   31 y.o. Female  MRN: [id number] Visit Date: 04/13/2022   No chief complaint on file.  Subjective    HPI  Follow up  -abdominal ultrasound showing findings consistent with contracted gallbladder and gallstones. Moderate hepatic steatosis.  -referral placed to GI provider for further evaluation.  -started on sertraline 25 mg to help with anxiety/depression   Medications: Outpatient Medications Prior to Visit  Medication Sig   acetaminophen (TYLENOL) 500 MG tablet Take 1,000 mg by mouth every 6 (six) hours as needed for mild pain or headache.   dicyclomine (BENTYL) 20 MG tablet TAKE 1 TABLET (20 MG TOTAL) BY MOUTH 3 (THREE) TIMES DAILY AS NEEDED FOR SPASMS.   fluconazole (DIFLUCAN) 150 MG tablet Take 1 tablet (150 mg total) by mouth every 3 (three) days. Take one tab, if not improved, take second pill 72 hours later   ibuprofen (ADVIL,MOTRIN) 600 MG tablet Take 1 tablet (600 mg total) by mouth every 6 (six) hours.   norgestimate-ethinyl estradiol (ORTHO-CYCLEN) 0.25-35 MG-MCG tablet TAKE 1 TABLET BY MOUTH EVERY DAY   sertraline (ZOLOFT) 25 MG tablet TAKE 1 TABLET (25 MG TOTAL) BY MOUTH DAILY.   No facility-administered medications prior to visit.    Review of Systems  {Labs (Optional):23779}   Objective    There were no vitals taken for this visit. BP Readings from Last 3 Encounters:  03/10/22 125/81  02/24/22 120/72  12/10/21 121/81    Wt Readings from Last 3 Encounters:  03/10/22 221 lb 8 oz (100.5 kg)  02/24/22 223 lb (101.2 kg)  12/10/21 227 lb 8 oz (103.2 kg)    Physical Exam  ***  No results found for any visits on 04/13/22.  Assessment & Plan     Problem List Items Addressed This Visit   None    No follow-ups on file.         Ronnell Freshwater, NP  [facility] at [facility] [phone] (phone) [phone] (fax)  [place]

## 2022-04-13 ENCOUNTER — Ambulatory Visit: Payer: BC Managed Care – PPO | Admitting: Nurse Practitioner

## 2022-04-22 NOTE — Progress Notes (Signed)
Established patient visit   Patient: Amber Dominguez   DOB: Aug 30, 1991   31 y.o. Female  MRN: [id number] Visit Date: 04/23/2022   Chief Complaint  Patient presents with   Follow-up   Subjective    HPI  Follow up visit -recent ultrasound of abdomen  due to chronic abdominal pain with diarrhea.  --overall results show the  following  --Linear echogenicity in the gallbladder fossa with posterior acoustic shadowing. This "wall echo shadow" complex is most commonly seen with a contracted, stone filled gallbladder as in chronic Cholecystitis --will refer to GI for further  evaluation - referral has been authorized. Patient will be given contact information to make new patient appointment.  -does have rx for dicyclomine to take as need. States that she can only take this at night as it makes her feel very tired and unable to concentrate. She will not take at work. She states that mostly this is fine if she does not eat.   Medications: Outpatient Medications Prior to Visit  Medication Sig   acetaminophen (TYLENOL) 500 MG tablet Take 1,000 mg by mouth every 6 (six) hours as needed for mild pain or headache.   fluconazole (DIFLUCAN) 150 MG tablet Take 1 tablet (150 mg total) by mouth every 3 (three) days. Take one tab, if not improved, take second pill 72 hours later   ibuprofen (ADVIL,MOTRIN) 600 MG tablet Take 1 tablet (600 mg total) by mouth every 6 (six) hours.   norgestimate-ethinyl estradiol (ORTHO-CYCLEN) 0.25-35 MG-MCG tablet TAKE 1 TABLET BY MOUTH EVERY DAY   sertraline (ZOLOFT) 25 MG tablet TAKE 1 TABLET (25 MG TOTAL) BY MOUTH DAILY.   [DISCONTINUED] dicyclomine (BENTYL) 20 MG tablet TAKE 1 TABLET (20 MG TOTAL) BY MOUTH 3 (THREE) TIMES DAILY AS NEEDED FOR SPASMS.   No facility-administered medications prior to visit.    Review of Systems  Constitutional:  Positive for fatigue. Negative for activity change, appetite change, chills and fever.  HENT:  Negative for congestion,  postnasal drip, rhinorrhea, sinus pressure, sinus pain, sneezing and sore throat.   Eyes: Negative.   Respiratory:  Negative for cough, chest tightness, shortness of breath and wheezing.   Cardiovascular:  Negative for chest pain and palpitations.  Gastrointestinal:  Positive for abdominal distention, abdominal pain and diarrhea. Negative for constipation, nausea and vomiting.       Intestinal cramping.   Endocrine: Negative for cold intolerance, heat intolerance, polydipsia and polyuria.  Genitourinary:  Negative for dyspareunia, dysuria, flank pain, frequency and urgency.  Musculoskeletal:  Negative for arthralgias, back pain and myalgias.  Skin:  Negative for rash.  Allergic/Immunologic: Negative for environmental allergies.  Neurological:  Negative for dizziness, weakness and headaches.  Hematological:  Negative for adenopathy.  Psychiatric/Behavioral:  Positive for dysphoric mood. The patient is nervous/anxious.        Symptoms are expecially bad around the time of her menstrual cycle.         Objective     Today's Vitals   04/23/22 1514  BP: 101/69  Pulse: 69  SpO2: 98%  Weight: 215 lb (97.5 kg)  Height: 5' 4.96" (1.65 m)   Body mass index is 35.82 kg/m.   BP Readings from Last 3 Encounters:  04/23/22 101/69  03/10/22 125/81  02/24/22 120/72    Wt Readings from Last 3 Encounters:  04/23/22 215 lb (97.5 kg)  03/10/22 221 lb 8 oz (100.5 kg)  02/24/22 223 lb (101.2 kg)    Physical Exam Vitals and nursing note reviewed.  Constitutional:      Appearance: Normal appearance. She is well-developed.  HENT:     Head: Normocephalic and atraumatic.  Eyes:     Pupils: Pupils are equal, round, and reactive to light.  Cardiovascular:     Rate and Rhythm: Normal rate and regular rhythm.     Pulses: Normal pulses.     Heart sounds: Normal heart sounds.  Pulmonary:     Effort: Pulmonary effort is normal.     Breath sounds: Normal breath sounds.  Abdominal:     General:  Bowel sounds are normal. There is no distension.     Palpations: Abdomen is soft. There is no mass.     Tenderness: There is abdominal tenderness. There is no right CVA tenderness, left CVA tenderness, guarding or rebound.     Hernia: No hernia is present.  Musculoskeletal:        General: Normal range of motion.     Cervical back: Normal range of motion and neck supple.  Lymphadenopathy:     Cervical: No cervical adenopathy.  Skin:    General: Skin is warm and dry.     Capillary Refill: Capillary refill takes less than 2 seconds.  Neurological:     General: No focal deficit present.     Mental Status: She is alert and oriented to person, place, and time.  Psychiatric:        Mood and Affect: Mood normal.        Behavior: Behavior normal.        Thought Content: Thought content normal.        Judgment: Judgment normal.       Assessment & Plan    1. Irritable bowel syndrome with diarrhea Change dicyclomine to levbid 0.375 mg up to twice daily as needed for intestinal cramping and diarrhea.  - hyoscyamine (LEVBID) 0.375 MG 12 hr tablet; Take 1 tablet (0.375 mg total) by mouth 2 (two) times daily as needed.  Dispense: 45 tablet; Refill: 2  2. Calculus of gallbladder with acute on chronic cholecystitis without obstruction Patient does have referral to GI provider for further evaluation and treatment   3. Premenstrual dysphoric disorder Improved. Continue 25 mg daily.    Problem List Items Addressed This Visit       Digestive   Irritable bowel syndrome with diarrhea - Primary   Relevant Medications   hyoscyamine (LEVBID) 0.375 MG 12 hr tablet   Calculus of gallbladder with acute on chronic cholecystitis without obstruction     Other   Premenstrual dysphoric disorder     Return in about 4 months (around 08/23/2022) for mood.         Ronnell Freshwater, NP  [facility] Primary Care at [facility] [phone] (phone) [phone] (fax)  [place]

## 2022-04-23 ENCOUNTER — Ambulatory Visit (INDEPENDENT_AMBULATORY_CARE_PROVIDER_SITE_OTHER): Payer: BC Managed Care – PPO | Admitting: Nurse Practitioner

## 2022-04-23 ENCOUNTER — Encounter: Payer: Self-pay | Admitting: Nurse Practitioner

## 2022-04-23 VITALS — BP 101/69 | HR 69 | Ht 64.96 in | Wt 215.0 lb

## 2022-04-23 DIAGNOSIS — K580 Irritable bowel syndrome with diarrhea: Secondary | ICD-10-CM

## 2022-04-23 DIAGNOSIS — F3281 Premenstrual dysphoric disorder: Secondary | ICD-10-CM | POA: Diagnosis not present

## 2022-04-23 DIAGNOSIS — K8012 Calculus of gallbladder with acute and chronic cholecystitis without obstruction: Secondary | ICD-10-CM | POA: Diagnosis not present

## 2022-04-23 MED ORDER — HYOSCYAMINE SULFATE ER 0.375 MG PO TB12: 0.3750 mg | ORAL_TABLET | Freq: Two times a day (BID) | ORAL | 2 refills | Status: DC | PRN

## 2022-04-28 ENCOUNTER — Encounter: Payer: Self-pay | Admitting: Nurse Practitioner

## 2022-04-30 DIAGNOSIS — K8012 Calculus of gallbladder with acute and chronic cholecystitis without obstruction: Secondary | ICD-10-CM | POA: Insufficient documentation

## 2022-05-29 ENCOUNTER — Encounter: Payer: Self-pay | Admitting: Nurse Practitioner

## 2022-05-29 ENCOUNTER — Ambulatory Visit (INDEPENDENT_AMBULATORY_CARE_PROVIDER_SITE_OTHER): Payer: BC Managed Care – PPO | Admitting: Nurse Practitioner

## 2022-05-29 VITALS — BP 120/86 | HR 95 | Ht 65.0 in | Wt 214.4 lb

## 2022-05-29 DIAGNOSIS — R1013 Epigastric pain: Secondary | ICD-10-CM | POA: Diagnosis not present

## 2022-05-29 DIAGNOSIS — R198 Other specified symptoms and signs involving the digestive system and abdomen: Secondary | ICD-10-CM | POA: Diagnosis not present

## 2022-05-29 DIAGNOSIS — R634 Abnormal weight loss: Secondary | ICD-10-CM | POA: Diagnosis not present

## 2022-05-29 MED ORDER — NA SULFATE-K SULFATE-MG SULF 17.5-3.13-1.6 GM/177ML PO SOLN: 1.0000 | Freq: Once | ORAL | 0 refills | Status: AC

## 2022-05-29 MED ORDER — OMEPRAZOLE 20 MG PO CPDR
20.0000 mg | DELAYED_RELEASE_CAPSULE | Freq: Every day | ORAL | 3 refills | Status: DC
Start: 2022-05-29 — End: 2022-08-24

## 2022-05-29 NOTE — Progress Notes (Addendum)
Chief Complaint: Upper abdominal pain, bowel changes, nausea   Assessment & Plan   # 31 yo female with a one year history of epigastric pain exacerbated by meals with associated nausea, bowel changes and unintentional weight loss of 13 pounds since mid June.  Etiology unclear but given the simultaneous onset of epigastric pain and bowel changes one would assume that the two are part of the same process.  Schedule for EGD and colonoscopy. The risks and benefits of EGD and colonoscopy with possible biopsies / polypectomy were discussed with the patient who agrees to proceed.  Trial of omeprazole 20 mg every morning.  If no improvement and EGD negative then should be able to discontinue it If EGD and colonoscopy are unrevealing and upper abdominal pain persists, consider surgical evaluation for possible possible chronic cholecystitis suggested by ultrasound.   ADDENDUM:  No endoscopic findings to explains pain. Dr. Silverio Decamp recommended follow up appt and HIDA is pain persisted. Nurse should be working on follow up appt   HPI:    Amber Dominguez is a very pleasant 31 y.o. year old female with a past medical history of hepatic steatosis , obesity .  See PMH / [place] for additional history  Patient is referred by PCP for epigastric pain, IBS, calculus of gallbladder / ? Chronic cholecystitis.   Interval history:  Amber Dominguez gives a one year history of epigastric pain, mainly related to eating but not always. She cannot correlate the pain to any certain types of food. Pain can sometimes radiate through to her back. Pain associated with nausea.   She takes Ibuprofen but generally no more than 6 tablets a month. No black stool or blood in stool. The epigastric pain has also been associated with bowel changes. She used to have a BM every 3-4 days except for transient diarrhea with menses. Now she has increased frequency of BMs with episodes of epigastric pain. Initial BM will be solid, progressively  getting more loose.   Recent US suggests cholelithaisis, maybe chronic cholecystitis. Liver chemistries are normal.   She is taking Bentyl. PCP try to change her to [place] but insurance wouldn't cover it. The switch was attempted because Bentyl makes her sleepy though it does help the epigastric pain  Now she taking taking it only at bedtime.  Amber Dominguez has unintentionally lost 10-15 pounds since June due to fear of meal related abdominal pain.    [facility] is unknown.  She is adopted   Previous Labs / Imaging::    Latest Ref Rng & Units 03/03/2022    9:58 AM 01/04/2018    4:00 AM 10/21/2017   10:23 AM  CBC  WBC 3.4 - 10.8 x10E3/uL 6.6  12.1  10.4   Hemoglobin 11.1 - 15.9 g/dL 13.1  13.6  11.9   Hematocrit 34.0 - 46.6 % 39.2  39.9  37.3   Platelets 150 - 450 x10E3/uL 264  213  229     Lab Results  Component Value Date   LIPASE 31 03/03/2022      Latest Ref Rng & Units 03/02/2022   11:20 AM 01/04/2018    4:00 AM 10/21/2017   10:23 AM  CMP  Glucose 70 - 99 mg/dL 87  89  70   BUN 6 - 20 mg/dL '10  8  4   '$ Creatinine 5.73 - 1.00 mg/dL 0.86  0.54  0.56   Sodium 134 - 144 mmol/L 141  137  139   Potassium 3.5 - 5.2 mmol/L 4.1  3.6  4.3   Chloride 96 - 106 mmol/L 105  109  106   CO2 20 - 29 mmol/L '20  17  19   '$ Calcium 8.7 - 10.2 mg/dL 9.7  8.9  9.0   Total Protein 6.0 - 8.5 g/dL 7.2  6.5  6.3   Total Bilirubin 0.0 - 1.2 mg/dL 0.2  0.3  0.2   Alkaline Phos 44 - 121 IU/L 72  145  82   AST 0 - 40 IU/L '15  22  12   '$ ALT 0 - 32 IU/L '14  13  11     '$ Previous GI Evaluation  none   Imaging:  US Abdomen Limited RUQ (LIVER/GB) CLINICAL DATA:  Epigastric pain and diarrhea  EXAM: ULTRASOUND ABDOMEN LIMITED RIGHT UPPER QUADRANT  COMPARISON:  None Available.  FINDINGS: Gallbladder:  Linear echogenicity within the gallbladder fossa with posterior acoustic shadowing. No tenderness with gallbladder palpation.  Common bile duct:  Diameter: Normal, 3 mm.  Liver:  Mild to moderately  increased hepatic echogenicity with areas of sparing adjacent the gallbladder. Portal vein is patent on color Doppler imaging with normal direction of blood flow towards the liver.  Other: None.  IMPRESSION: Linear echogenicity in the gallbladder fossa with posterior acoustic shadowing. This "wall echo shadow" complex is most commonly seen with a contracted, stone filled gallbladder as in chronic cholecystitis. No evidence of acute superimposed inflammation.  Moderate hepatic steatosis.  Electronically Signed   By: Abigail Miyamoto M.D.   On: 03/13/2022 11:50    Past Medical History:  Diagnosis Date   Chlamydia    Chronic hypertension complicating pregnancy, antepartum 06/28/2017   [ X] Aspirin 81 mg daily after 12 weeks; discontinue after 36 weeks Current antihypertensives:  None   Baseline and surveillance labs (pulled in from [facility], refresh links as needed)  Lab Results  Component Value Date   PLT 246 05/19/2017    Antenatal Testing CHTN - O10.919  Group I  BP < 140/90, no preeclampsia, AGA,  nml AFV, +/- meds    Group II BP > 140/90, on meds, no preeclampsia, AGA, nml AFV   Headache    Hypertension    Past Surgical History:  Procedure Laterality Date   TONSILLECTOMY     WISDOM TOOTH EXTRACTION     Family History  Problem Relation Age of Onset   Rheum arthritis Mother    Heart disease Father    Social History   Tobacco Use   Smoking status: Never   Smokeless tobacco: Never  Vaping Use   Vaping Use: Never used  Substance Use Topics   Alcohol use: Yes    Comment: rarely   Drug use: No   Current Outpatient Medications  Medication Sig Dispense Refill   acetaminophen (TYLENOL) 500 MG tablet Take 1,000 mg by mouth every 6 (six) hours as needed for mild pain or headache.     fluconazole (DIFLUCAN) 150 MG tablet Take 1 tablet (150 mg total) by mouth every 3 (three) days. Take one tab, if not improved, take second pill 72 hours later 2 tablet 0   ibuprofen (ADVIL,MOTRIN)  600 MG tablet Take 1 tablet (600 mg total) by mouth every 6 (six) hours. 30 tablet 0   norgestimate-ethinyl estradiol (ORTHO-CYCLEN) 0.25-35 MG-MCG tablet TAKE 1 TABLET BY MOUTH EVERY DAY 84 tablet 1   sertraline (ZOLOFT) 25 MG tablet TAKE 1 TABLET (25 MG TOTAL) BY MOUTH DAILY. 90 tablet 0   No current facility-administered medications for this visit.  Allergies  Allergen Reactions   Cefzil [Cefprozil] Hives    Coughing, fever     Review of Systems: All systems reviewed and negative except where noted in HPI.   Wt Readings from Last 3 Encounters:  04/23/22 215 lb (97.5 kg)  03/10/22 221 lb 8 oz (100.5 kg)  02/24/22 223 lb (101.2 kg)    Physical Exam   BP 120/86   Pulse 95   Ht '5\' 5"'$  (1.651 m)   Wt 214 lb 6.4 oz (97.3 kg)   SpO2 98%   BMI 35.68 kg/m  Constitutional:  Generally well appearing female in no acute distress. Psychiatric: Pleasant. Normal mood and affect. Behavior is normal. EENT: Pupils normal.  Conjunctivae are normal. No scleral icterus. Neck supple.  Cardiovascular: Normal rate, regular rhythm. No edema Pulmonary/chest: Effort normal and breath sounds normal. No wheezing, rales or rhonchi. Abdominal: Soft, nondistended, nontender. Bowel sounds active throughout. There are no masses palpable. No hepatomegaly. Neurological: Alert and oriented to person place and time. Skin: Skin is warm and dry. No rashes noted.  Tye Savoy, NP  05/29/2022, 8:11 AM  Cc:  Referring Provider Ronnell Freshwater, NP

## 2022-05-29 NOTE — Patient Instructions (Addendum)
If you are age 32 or younger, your body mass index should be between 19-25. Your Body mass index is 35.68 kg/m. If this is out of the aformentioned range listed, please consider follow up with your Primary Care Provider.   __________________________________________________________  The [facility] providers would like to encourage you to use [facility] to communicate with providers for non-urgent requests or questions.  Due to long hold times on the telephone, sending your provider a message by [facility] may be a faster and more efficient way to get a response.  Please allow 48 business hours for a response.  Please remember that this is for non-urgent requests.   Due to recent changes in healthcare laws, you may see the results of your imaging and laboratory studies on MyChart before your provider has had a chance to review them.  We understand that in some cases there may be results that are confusing or concerning to you. Not all laboratory results come back in the same time frame and the provider may be waiting for multiple results in order to interpret others.  Please give [place] 48 hours in order for your provider to thoroughly review all the results before contacting the office for clarification of your results.   We have sent the following medications to your pharmacy for you to pick up at your convenience: Omeprazole, Suprep  You have been scheduled for an endoscopy and colonoscopy. Please follow the written instructions given to you at your visit today. Please pick up your prep supplies at the pharmacy within the next 1-3 days. If you use inhalers (even only as needed), please bring them with you on the day of your procedure.     Thank you for choosing me and [facility].

## 2022-06-10 ENCOUNTER — Ambulatory Visit (AMBULATORY_SURGERY_CENTER): Payer: BC Managed Care – PPO | Admitting: Gastroenterology

## 2022-06-10 ENCOUNTER — Encounter: Payer: Self-pay | Admitting: Gastroenterology

## 2022-06-10 VITALS — BP 133/77 | HR 68 | Temp 98.0°F | Resp 13 | Ht 65.0 in | Wt 214.0 lb

## 2022-06-10 DIAGNOSIS — R194 Change in bowel habit: Secondary | ICD-10-CM | POA: Diagnosis not present

## 2022-06-10 DIAGNOSIS — R198 Other specified symptoms and signs involving the digestive system and abdomen: Secondary | ICD-10-CM

## 2022-06-10 DIAGNOSIS — R197 Diarrhea, unspecified: Secondary | ICD-10-CM | POA: Diagnosis not present

## 2022-06-10 DIAGNOSIS — R1013 Epigastric pain: Secondary | ICD-10-CM

## 2022-06-10 DIAGNOSIS — R634 Abnormal weight loss: Secondary | ICD-10-CM

## 2022-06-10 DIAGNOSIS — D123 Benign neoplasm of transverse colon: Secondary | ICD-10-CM

## 2022-06-10 DIAGNOSIS — K649 Unspecified hemorrhoids: Secondary | ICD-10-CM

## 2022-06-10 DIAGNOSIS — K635 Polyp of colon: Secondary | ICD-10-CM

## 2022-06-10 DIAGNOSIS — G8929 Other chronic pain: Secondary | ICD-10-CM

## 2022-06-10 DIAGNOSIS — K589 Irritable bowel syndrome without diarrhea: Secondary | ICD-10-CM | POA: Diagnosis not present

## 2022-06-10 MED ORDER — SODIUM CHLORIDE 0.9 % IV SOLN: 500.0000 mL | Freq: Once | INTRAVENOUS | Status: DC

## 2022-06-10 NOTE — Progress Notes (Signed)
Pt's states no medical or surgical changes since previsit or office visit. 

## 2022-06-10 NOTE — Op Note (Signed)
[facility] Patient Name: Amber Dominguez Procedure Date: 06/10/2022 3:57 PM MRN: [id number] Endoscopist: Mauri Pole , MD Age: 31 Referring MD:  Date of Birth: 05-21-1991 Gender: Female Account #: [id number] Procedure:                Upper GI endoscopy Indications:              Epigastric abdominal pain Medicines:                Monitored Anesthesia Care Procedure:                Pre-Anesthesia Assessment:                           - Prior to the procedure, a History and Physical                            was performed, and patient medications and                            allergies were reviewed. The patient's tolerance of                            previous anesthesia was also reviewed. The risks                            and benefits of the procedure and the sedation                            options and risks were discussed with the patient.                            All questions were answered, and informed consent                            was obtained. Prior Anticoagulants: The patient has                            taken no previous anticoagulant or antiplatelet                            agents. ASA Grade Assessment: II - A patient with                            mild systemic disease. After reviewing the risks                            and benefits, the patient was deemed in                            satisfactory condition to undergo the procedure.                           After obtaining informed consent, the endoscope was  passed under direct vision. Throughout the                            procedure, the patient's blood pressure, pulse, and                            oxygen saturations were monitored continuously. The                            GIF [id number] was introduced through the                            mouth, and advanced to the second part of duodenum.                            The upper GI endoscopy  was accomplished without                            difficulty. The patient tolerated the procedure                            well. Scope In: Scope Out: Findings:                 The Z-line was regular and was found 36 cm from the                            incisors.                           The examined esophagus was normal.                           The entire examined stomach was normal.                           The cardia and gastric fundus were normal on                            retroflexion.                           The examined duodenum was normal. Complications:            No immediate complications. Estimated Blood Loss:     Estimated blood loss: none. Impression:               - Z-line regular, 36 cm from the incisors.                           - Normal esophagus.                           - Normal stomach.                           - Normal examined duodenum.                           -  No specimens collected. Recommendation:           - Patient has a contact number available for                            emergencies. The signs and symptoms of potential                            delayed complications were discussed with the                            patient. Return to normal activities tomorrow.                            Written discharge instructions were provided to the                            patient.                           - Resume previous diet.                           - Continue present medications.                           - See the other procedure note for documentation of                            additional recommendations. Mauri Pole, MD 06/10/2022 4:25:56 PM This report has been signed electronically.

## 2022-06-10 NOTE — Patient Instructions (Signed)
Await pathology results.  Handout on polyps provided.  YOU HAD AN ENDOSCOPIC PROCEDURE TODAY AT THE [facility]:   Refer to the procedure report that was given to you for any specific questions about what was found during the examination.  If the procedure report does not answer your questions, please call your gastroenterologist to clarify.  If you requested that your care partner not be given the details of your procedure findings, then the procedure report has been included in a sealed envelope for you to review at your convenience later.  YOU SHOULD EXPECT: Some feelings of bloating in the abdomen. Passage of more gas than usual.  Walking can help get rid of the air that was put into your GI tract during the procedure and reduce the bloating. If you had a lower endoscopy (such as a colonoscopy or flexible sigmoidoscopy) you may notice spotting of blood in your stool or on the toilet paper. If you underwent a bowel prep for your procedure, you may not have a normal bowel movement for a few days.  Please Note:  You might notice some irritation and congestion in your nose or some drainage.  This is from the oxygen used during your procedure.  There is no need for concern and it should clear up in a day or so.  SYMPTOMS TO REPORT IMMEDIATELY:  Following lower endoscopy (colonoscopy or flexible sigmoidoscopy):  Excessive amounts of blood in the stool  Significant tenderness or worsening of abdominal pains  Swelling of the abdomen that is new, acute  Fever of 100F or higher  Following upper endoscopy (EGD)  Vomiting of blood or coffee ground material  New chest pain or pain under the shoulder blades  Painful or persistently difficult swallowing  New shortness of breath  Fever of 100F or higher  Black, tarry-looking stools  For urgent or emergent issues, a gastroenterologist can be reached at any hour by calling [phone]. Do not use MyChart messaging for urgent concerns.     DIET:  We do recommend a small meal at first, but then you may proceed to your regular diet.  Drink plenty of fluids but you should avoid alcoholic beverages for 24 hours.  ACTIVITY:  You should plan to take it easy for the rest of today and you should NOT DRIVE or use heavy machinery until tomorrow (because of the sedation medicines used during the test).    FOLLOW UP: Our staff will call the number listed on your records the next business day following your procedure.  We will call around 7:15- 8:00 am to check on you and address any questions or concerns that you may have regarding the information given to you following your procedure. If we do not reach you, we will leave a message.     If any biopsies were taken you will be contacted by phone or by letter within the next 1-3 weeks.  Please call us at [phone] if you have not heard about the biopsies in 3 weeks.    SIGNATURES/CONFIDENTIALITY: You and/or your care partner have signed paperwork which will be entered into your electronic medical record.  These signatures attest to the fact that that the information above on your After Visit Summary has been reviewed and is understood.  Full responsibility of the confidentiality of this discharge information lies with you and/or your care-partner.  

## 2022-06-10 NOTE — Progress Notes (Unsigned)
Please refer to office visit note 05/29/22 by Tye Savoy. No additional changes in H&P Patient is appropriate for planned procedure(s) and anesthesia in an ambulatory setting  K. Denzil Magnuson , MD [phone]

## 2022-06-10 NOTE — Progress Notes (Unsigned)
Pt resting comfortably. VSS. Airway intact. SBAR complete to RN. All questions answered.   

## 2022-06-10 NOTE — Op Note (Signed)
[facility] Patient Name: Amber Dominguez Procedure Date: 06/10/2022 3:56 PM MRN: [id number] Endoscopist: Mauri Pole , MD Age: 31 Referring MD:  Date of Birth: 06-07-1991 Gender: Female Account #: [id number] Procedure:                Colonoscopy Indications:              Obtain more precise diagnosis of inflammatory bowel                            disease, Epigastric abdominal pain, Change in bowel                            habits, Weight loss Medicines:                Monitored Anesthesia Care Procedure:                Pre-Anesthesia Assessment:                           - Prior to the procedure, a History and Physical                            was performed, and patient medications and                            allergies were reviewed. The patient's tolerance of                            previous anesthesia was also reviewed. The risks                            and benefits of the procedure and the sedation                            options and risks were discussed with the patient.                            All questions were answered, and informed consent                            was obtained. Prior Anticoagulants: The patient has                            taken no previous anticoagulant or antiplatelet                            agents. ASA Grade Assessment: II - A patient with                            mild systemic disease. After reviewing the risks                            and benefits, the patient was deemed in  satisfactory condition to undergo the procedure.                           After obtaining informed consent, the colonoscope                            was passed under direct vision. Throughout the                            procedure, the patient's blood pressure, pulse, and                            oxygen saturations were monitored continuously. The                            Olympus PCF-H190DL  (#[id number]) Colonoscope was                            introduced through the anus and advanced to the the                            cecum, identified by appendiceal orifice and                            ileocecal valve. The colonoscopy was performed                            without difficulty. The patient tolerated the                            procedure well. The quality of the bowel                            preparation was good. The ileocecal valve,                            appendiceal orifice, and rectum were photographed. Scope In: 4:10:23 PM Scope Out: 4:21:45 PM Scope Withdrawal Time: 0 hours 4 minutes 26 seconds  Total Procedure Duration: 0 hours 11 minutes 22 seconds  Findings:                 The perianal and digital rectal examinations were                            normal.                           A 5 mm polyp was found in the transverse colon. The                            polyp was sessile. The polyp was removed with a                            cold snare. Resection and retrieval were complete.  Normal mucosa was found in the entire colon.                           Non-bleeding external and internal hemorrhoids were                            found during retroflexion. The hemorrhoids were                            small. Complications:            No immediate complications. Estimated Blood Loss:     Estimated blood loss was minimal. Impression:               - One 5 mm polyp in the transverse colon, removed                            with a cold snare. Resected and retrieved.                           - Normal mucosa in the entire examined colon.                           - Non-bleeding external and internal hemorrhoids. Recommendation:           - Patient has a contact number available for                            emergencies. The signs and symptoms of potential                            delayed complications were discussed with  the                            patient. Return to normal activities tomorrow.                            Written discharge instructions were provided to the                            patient.                           - Resume previous diet.                           - Continue present medications.                           - Await pathology results.                           - Repeat colonoscopy in 5-10 years for surveillance                            based on pathology results. Mauri Pole, MD 06/10/2022 4:28:28 PM This report has  been signed electronically.

## 2022-06-10 NOTE — Progress Notes (Unsigned)
Called to room to assist during endoscopic procedure.  Patient ID and intended procedure confirmed with present staff. Received instructions for my participation in the procedure from the performing physician.  

## 2022-06-11 ENCOUNTER — Telehealth: Payer: Self-pay

## 2022-06-11 NOTE — Telephone Encounter (Signed)
  Follow up Call-     06/10/2022    2:57 PM  Call back number  Post procedure Call Back phone  # [phone]  Permission to leave phone message Yes     Patient questions:  Do you have a fever, pain , or abdominal swelling? No. Pain Score  0 *  Have you tolerated food without any problems? Yes.    Have you been able to return to your normal activities? Yes.    Do you have any questions about your discharge instructions: Diet   No. Medications  No. Follow up visit  No.  Do you have questions or concerns about your Care? No.  Actions: * If pain score is 4 or above: No action needed, pain <4.

## 2022-06-18 ENCOUNTER — Encounter: Payer: Self-pay | Admitting: Gastroenterology

## 2022-06-25 ENCOUNTER — Ambulatory Visit
Admission: EM | Admit: 2022-06-25 | Discharge: 2022-06-25 | Disposition: A | Discharge status: Home / Self Care | Payer: BC Managed Care – PPO | Attending: Family Medicine | Admitting: Family Medicine

## 2022-06-25 ENCOUNTER — Ambulatory Visit (HOSPITAL_COMMUNITY): Payer: BC Managed Care – PPO

## 2022-06-25 ENCOUNTER — Other Ambulatory Visit: Payer: Self-pay

## 2022-06-25 ENCOUNTER — Encounter: Payer: Self-pay | Admitting: Emergency Medicine

## 2022-06-25 DIAGNOSIS — Z1152 Encounter for screening for COVID-19: Secondary | ICD-10-CM | POA: Insufficient documentation

## 2022-06-25 DIAGNOSIS — Z20818 Contact with and (suspected) exposure to other bacterial communicable diseases: Secondary | ICD-10-CM | POA: Insufficient documentation

## 2022-06-25 DIAGNOSIS — J029 Acute pharyngitis, unspecified: Secondary | ICD-10-CM | POA: Diagnosis not present

## 2022-06-25 LAB — POCT RAPID STREP A (OFFICE): Rapid Strep A Screen: NEGATIVE

## 2022-06-25 NOTE — ED Provider Notes (Signed)
[facility]   [id number] 06/25/22 Arrival Time: 3976  ASSESSMENT & PLAN:  1. Strep throat exposure   2. Sore throat    Rapid strep negative. Throat culture sent. Discussed typical duration of viral illnesses. Viral testing sent. OTC symptom care as needed.    Follow-up Information     Ronnell Freshwater, NP.   Specialty: Family Medicine Why: As needed. Contact information: [place] [address] [phone]                 Reviewed expectations re: course of current medical issues. Questions answered. Outlined signs and symptoms indicating need for more acute intervention. Understanding verbalized. After Visit Summary given.   SUBJECTIVE: History from: Patient. Amber Dominguez is a 31 y.o. female. Reports: Pt here for sore throat x 2 days; pt sts son and husband tested positive for strep. Denies: fever. Normal PO intake without n/v/d.  OBJECTIVE:  Vitals:   06/25/22 1908  BP: 125/83  Pulse: 81  Resp: 18  Temp: 98.6 F (37 C)  TempSrc: Oral  SpO2: 98%    General appearance: alert; no distress Eyes: PERRLA; EOMI; conjunctiva normal HENT: [place]; AT; with nasal congestion; throat mild cobblestoning Neck: supple  Lungs: speaks full sentences without difficulty; unlabored Extremities: no edema Skin: warm and dry Neurologic: normal gait Psychological: alert and cooperative; normal mood and affect  Labs: Results for orders placed or performed during the hospital encounter of 06/25/22  POCT rapid strep A  Result Value Ref Range   Rapid Strep A Screen Negative Negative   Labs Reviewed  CULTURE, GROUP A STREP ([place])  RESP PANEL BY RT-PCR (FLU A&B, COVID) [id number]  POCT RAPID STREP A (OFFICE)    Imaging: No results found.  Allergies  Allergen Reactions   Cefzil [Cefprozil] Hives    Coughing, fever    Past Medical History:  Diagnosis Date   Anxiety 2023   Chlamydia    Chronic hypertension complicating  pregnancy, antepartum 06/28/2017   [ X] Aspirin 81 mg daily after 12 weeks; discontinue after 36 weeks Current antihypertensives:  None   Baseline and surveillance labs (pulled in from [facility], refresh links as needed)  Lab Results  Component Value Date   PLT 246 05/19/2017    Antenatal Testing CHTN - O10.919  Group I  BP < 140/90, no preeclampsia, AGA,  nml AFV, +/- meds    Group II BP > 140/90, on meds, no preeclampsia, AGA, nml AFV   Headache    High blood pressure 05/2017   Hypertension    Social History   Socioeconomic History   Marital status: Married    Spouse name: Not on file   Number of children: 2   Years of education: Not on file   Highest education level: Not on file  Occupational History   Not on file  Tobacco Use   Smoking status: Never   Smokeless tobacco: Never  Vaping Use   Vaping Use: Never used  Substance and Sexual Activity   Alcohol use: Yes    Comment: rarely   Drug use: No   Sexual activity: Yes    Birth control/protection: None  Other Topics Concern   Not on file  Social History Narrative   Not on file   Social Determinants of Health   Financial Resource Strain: Not on file  Food Insecurity: Not on file  Transportation Needs: Not on file  Physical Activity: Not on file  Stress: Not on file  Social Connections: Not on file  Intimate Partner Violence: Not on file   Family History  Problem Relation Age of Onset   Rheum arthritis Mother    Heart disease Father    Colon cancer Neg Hx    Esophageal cancer Neg Hx    Stomach cancer Neg Hx    Rectal cancer Neg Hx    Past Surgical History:  Procedure Laterality Date   TONSILLECTOMY     WISDOM TOOTH EXTRACTION       Vanessa Kick, MD 06/25/22 1931

## 2022-06-25 NOTE — ED Triage Notes (Signed)
Pt here for sore throat x 2 days; pt sts son and husband tested positive for strep

## 2022-06-25 NOTE — Discharge Instructions (Signed)
You may use over the counter ibuprofen or acetaminophen as needed.  For a sore throat, over the counter products such as [employer] Peroxyl Mouth Sore Rinse or Chloraseptic Sore Throat Spray may provide some temporary relief. Your rapid strep test was negative today. We have sent your throat swab for culture and will let you know of any positive results. 

## 2022-06-26 ENCOUNTER — Ambulatory Visit (HOSPITAL_COMMUNITY): Payer: BC Managed Care – PPO

## 2022-06-26 LAB — RESP PANEL BY RT-PCR (FLU A&B, COVID) ARPGX2
Influenza A by PCR: NEGATIVE
Influenza B by PCR: NEGATIVE
SARS Coronavirus 2 by RT PCR: NEGATIVE

## 2022-06-29 LAB — CULTURE, GROUP A STREP (THRC)

## 2022-07-14 ENCOUNTER — Other Ambulatory Visit: Payer: Self-pay | Admitting: Nurse Practitioner

## 2022-07-14 DIAGNOSIS — F3281 Premenstrual dysphoric disorder: Secondary | ICD-10-CM

## 2022-07-29 ENCOUNTER — Ambulatory Visit (INDEPENDENT_AMBULATORY_CARE_PROVIDER_SITE_OTHER): Payer: BC Managed Care – PPO | Admitting: General Practice

## 2022-07-29 VITALS — BP 137/84 | HR 81 | Ht 65.0 in | Wt 214.0 lb

## 2022-07-29 DIAGNOSIS — Z3200 Encounter for pregnancy test, result unknown: Secondary | ICD-10-CM

## 2022-07-29 DIAGNOSIS — Z3201 Encounter for pregnancy test, result positive: Secondary | ICD-10-CM

## 2022-07-29 LAB — POCT URINE PREGNANCY: Preg Test, Ur: POSITIVE — AB

## 2022-07-29 NOTE — Progress Notes (Unsigned)
Ms. Cifuentes presents today for UPT. She has no unusual complaints. LMP: 06-11-22  OBJECTIVE: Appears well, in no apparent distress.  OB History     Gravida  3   Para  2   Term  2   Preterm      AB      Living  2      SAB      IAB      Ectopic      Multiple  0   Live Births  2          Home UPT Result: Positive x2  In-Office UPT result: Positive I have reviewed the patient's medical, obstetrical, social, and family histories, and medications.   Pt states she "feels her organs twisting every time she moves". Pt advised to make an appt to see a provider.   ASSESSMENT: Positive pregnancy test  PLAN Prenatal care to be completed at: [facility]

## 2022-08-05 ENCOUNTER — Inpatient Hospital Stay (HOSPITAL_COMMUNITY)
Admission: AD | Admit: 2022-08-05 | Discharge: 2022-08-05 | Disposition: A | Discharge status: Home / Self Care | Payer: BC Managed Care – PPO | Attending: Obstetrics and Gynecology | Admitting: Obstetrics and Gynecology

## 2022-08-05 ENCOUNTER — Inpatient Hospital Stay (HOSPITAL_COMMUNITY): Payer: BC Managed Care – PPO

## 2022-08-05 ENCOUNTER — Encounter (HOSPITAL_COMMUNITY): Payer: Self-pay

## 2022-08-05 ENCOUNTER — Other Ambulatory Visit: Payer: Self-pay

## 2022-08-05 DIAGNOSIS — O26891 Other specified pregnancy related conditions, first trimester: Secondary | ICD-10-CM

## 2022-08-05 DIAGNOSIS — F419 Anxiety disorder, unspecified: Secondary | ICD-10-CM | POA: Diagnosis not present

## 2022-08-05 DIAGNOSIS — B379 Candidiasis, unspecified: Secondary | ICD-10-CM | POA: Diagnosis not present

## 2022-08-05 DIAGNOSIS — O99891 Other specified diseases and conditions complicating pregnancy: Secondary | ICD-10-CM | POA: Insufficient documentation

## 2022-08-05 DIAGNOSIS — O468X1 Other antepartum hemorrhage, first trimester: Secondary | ICD-10-CM | POA: Diagnosis not present

## 2022-08-05 DIAGNOSIS — O99341 Other mental disorders complicating pregnancy, first trimester: Secondary | ICD-10-CM | POA: Insufficient documentation

## 2022-08-05 DIAGNOSIS — R1030 Lower abdominal pain, unspecified: Secondary | ICD-10-CM | POA: Diagnosis not present

## 2022-08-05 DIAGNOSIS — Z3A08 8 weeks gestation of pregnancy: Secondary | ICD-10-CM | POA: Diagnosis not present

## 2022-08-05 DIAGNOSIS — Z3A01 Less than 8 weeks gestation of pregnancy: Secondary | ICD-10-CM

## 2022-08-05 DIAGNOSIS — O208 Other hemorrhage in early pregnancy: Secondary | ICD-10-CM | POA: Insufficient documentation

## 2022-08-05 DIAGNOSIS — O418X10 Other specified disorders of amniotic fluid and membranes, first trimester, not applicable or unspecified: Secondary | ICD-10-CM

## 2022-08-05 DIAGNOSIS — R0789 Other chest pain: Secondary | ICD-10-CM | POA: Diagnosis not present

## 2022-08-05 DIAGNOSIS — R109 Unspecified abdominal pain: Secondary | ICD-10-CM | POA: Diagnosis not present

## 2022-08-05 DIAGNOSIS — O209 Hemorrhage in early pregnancy, unspecified: Secondary | ICD-10-CM | POA: Diagnosis not present

## 2022-08-05 LAB — BASIC METABOLIC PANEL
Anion gap: 16 — ABNORMAL HIGH (ref 5–15)
BUN: 5 mg/dL — ABNORMAL LOW (ref 6–20)
CO2: 22 mmol/L (ref 22–32)
Calcium: 9.6 mg/dL (ref 8.9–10.3)
Chloride: 102 mmol/L (ref 98–111)
Creatinine, Ser: 0.63 mg/dL (ref 0.44–1.00)
GFR, Estimated: >60 mL/min (ref >60)
Glucose, Bld: 82 mg/dL (ref 70–99)
Potassium: 3.5 mmol/L (ref 3.5–5.1)
Sodium: 140 mmol/L (ref 135–145)

## 2022-08-05 LAB — CBC
HCT: 39.5 % (ref 36.0–46.0)
Hemoglobin: 12.9 g/dL (ref 12.0–15.0)
MCH: 29.1 pg (ref 26.0–34.0)
MCHC: 32.7 g/dL (ref 30.0–36.0)
MCV: 89.2 fL (ref 80.0–100.0)
Platelets: 261 10*3/uL (ref 150–400)
RBC: 4.43 MIL/uL (ref 3.87–5.11)
RDW: 12.5 % (ref 11.5–15.5)
WBC: 5.5 10*3/uL (ref 4.0–10.5)
nRBC: 0 % (ref 0.0–0.2)

## 2022-08-05 LAB — I-STAT BETA HCG BLOOD, ED (MC, WL, AP ONLY): I-stat hCG, quantitative: >2000 m[IU]/mL — ABNORMAL HIGH (ref <5)

## 2022-08-05 LAB — WET PREP, GENITAL
Clue Cells Wet Prep HPF POC: NONE SEEN
Sperm: NONE SEEN
Trich, Wet Prep: NONE SEEN
WBC, Wet Prep HPF POC: >=10 — AB (ref <10)

## 2022-08-05 LAB — HCG, QUANTITATIVE, PREGNANCY: hCG, Beta Chain, Quant, S: 65268 m[IU]/mL — ABNORMAL HIGH (ref <5)

## 2022-08-05 LAB — HIV ANTIBODY (ROUTINE TESTING W REFLEX): HIV Screen 4th Generation wRfx: NONREACTIVE

## 2022-08-05 LAB — RPR: RPR Ser Ql: NONREACTIVE

## 2022-08-05 LAB — TROPONIN I (HIGH SENSITIVITY)
Troponin I (High Sensitivity): <2 ng/L (ref <18)
Troponin I (High Sensitivity): <2 ng/L (ref <18)

## 2022-08-05 MED ORDER — TERCONAZOLE 0.4 % VA CREA: 1.0000 | TOPICAL_CREAM | Freq: Every day | VAGINAL | 0 refills | Status: DC

## 2022-08-05 NOTE — MAU Note (Signed)
Amber Dominguez is a 32 y.o. at 71w6dhere in MAU reporting: has been cramping for the past couple of weeks, yesterday pain got worse and started bleeding around noon. Possibly passed some tissue last night. Saw some small clots this morning. Started having chest pain when she got to the hospital.  LMP: 06/11/2022  Onset of complaint: yesterday  Pain score: abdomen 3/10, chest 2/10  Vitals:   08/05/22 0850 08/05/22 1005  BP: 139/82 (!) 141/57  Pulse: 89 75  Resp: 16 16  Temp: 98.7 F (37.1 C) 98.2 F (36.8 C)  SpO2: 100% 100%     FHT:NA  Lab orders placed from triage: UA

## 2022-08-05 NOTE — ED Provider Triage Note (Signed)
Emergency Medicine Provider OB Triage Evaluation Note  Amber Dominguez is a 31 y.o. female, G3P2002, at 67w6dgestation who presents to the emergency department with complaints of lower abdominal pain and vaginal bleeding no dyspnea.  Review of  Systems  Positive: Lower abdominal pain started yesterday Negative: No syncope, no vomiting  Physical Exam  BP 139/82 (BP Location: Right Arm)   Pulse 89   Temp 98.7 F (37.1 C)   Resp 16   LMP 06/11/2022   SpO2 100%  General: Awake, no distress  HEENT: Atraumatic  Resp: Normal effort  Cardiac: Normal rate Abd: Nondistended, nontender but the patient notes that her lower abdomen is where she is experiencing pain occasionally. MSK: Moves all extremities without difficulty Neuro: Speech clear  Medical Decision Making  Pt evaluated for pregnancy concern and is stable for transfer to MAU. Pt is in agreement with plan for transfer.  9:11 AM Discussed with MAU APP, Renata, who accepts patient in transfer.  Clinical Impression  No diagnosis found.     LCarmin Muskrat MD 08/05/22 [phone]

## 2022-08-05 NOTE — MAU Provider Note (Signed)
History     CSN: [id number]  Arrival date and time: 08/05/22 0840   Event Date/Time   First Provider Initiated Contact with Patient 08/05/22 1029      Chief Complaint  Patient presents with   Abdominal Pain   Vaginal Bleeding   Chest Pain   Amber Dominguez , a  31 y.o. [id number] at 80w6dpresents to MAU with complaints of intermittent pelvic pain for weeks and new onset of vaginal bleeding. Patient rated pelvic pain 6/10 however is currently a 2/10. Patient states she started bleeding yesterday, that started out as spotting, but throughout the day progressed to bright red period bleeding. She also noted passing clots the size of skittles, but states "there was a lot ofd them." She also noted possibly passing tissue yesterday in the toilet. She endorses wearing a pad, but denies saturating a pad "only changing 1 per day." She denies painful or difficulty with urination and denies abnormal vaginal discharge.         OB History     Gravida  3   Para  2   Term  2   Preterm      AB      Living  2      SAB      IAB      Ectopic      Multiple  0   Live Births  2           Past Medical History:  Diagnosis Date   Anxiety 2023   Chlamydia    Chronic hypertension complicating pregnancy, antepartum 06/28/2017   [ X] Aspirin 81 mg daily after 12 weeks; discontinue after 36 weeks Current antihypertensives:  None   Baseline and surveillance labs [facility] as needed)  Lab Results  Component Value Date   PLT 246 05/19/2017    Antenatal Testing CHTN - O10.919  Group I  BP < 140/90, no preeclampsia, AGA,  nml AFV, +/- meds    Group II BP > 140/90, on meds, no preeclampsia, AGA, nml AFV   Headache    High blood pressure 05/2017   Hypertension     Past Surgical History:  Procedure Laterality Date   TONSILLECTOMY     WISDOM TOOTH EXTRACTION      Family History  Problem Relation Age of Onset   Rheum arthritis Mother    Heart disease  Father    Colon cancer Neg Hx    Esophageal cancer Neg Hx    Stomach cancer Neg Hx    Rectal cancer Neg Hx     Social History   Tobacco Use   Smoking status: Never   Smokeless tobacco: Never  Vaping Use   Vaping Use: Never used  Substance Use Topics   Alcohol use: Not Currently    Comment: rarely   Drug use: No    Allergies:  Allergies  Allergen Reactions   Cefzil [Cefprozil] Hives    Coughing, fever    Medications Prior to Admission  Medication Sig Dispense Refill Last Dose   dicyclomine (BENTYL) 20 MG tablet Take 20 mg by mouth every 6 (six) hours.      norgestimate-ethinyl estradiol (ORTHO-CYCLEN) 0.25-35 MG-MCG tablet TAKE 1 TABLET BY MOUTH EVERY DAY (Patient not taking: Reported on 07/29/2022) 84 tablet 1    omeprazole (PRILOSEC) 20 MG capsule Take 1 capsule (20 mg total) by mouth daily before breakfast. (Patient not taking: Reported on 07/29/2022) 30 capsule 3    sertraline (  ZOLOFT) 25 MG tablet TAKE 1 TABLET (25 MG TOTAL) BY MOUTH DAILY. 90 tablet 0     Review of Systems  Constitutional:  Negative for chills, fatigue and fever.  Eyes:  Negative for pain and visual disturbance.  Respiratory:  Negative for apnea, shortness of breath and wheezing.   Cardiovascular:  Negative for chest pain and palpitations.  Gastrointestinal:  Negative for abdominal pain, constipation, diarrhea, nausea and vomiting.  Genitourinary:  Positive for pelvic pain and vaginal bleeding. Negative for difficulty urinating, dysuria, vaginal discharge and vaginal pain.  Musculoskeletal:  Positive for back pain.  Neurological:  Negative for seizures, weakness and headaches.  Psychiatric/Behavioral:  Negative for suicidal ideas.    Physical Exam   Blood pressure (!) 127/55, pulse 77, temperature 98.2 F (36.8 C), temperature source Oral, resp. rate 16, height '5\' 5"'$  (1.651 m), weight 96.2 kg, last menstrual period 06/11/2022, SpO2 100 %, currently breastfeeding.  Physical Exam Vitals and  nursing note reviewed. Exam conducted with a chaperone present.  Constitutional:      General: She is not in acute distress.    Appearance: Normal appearance.     Comments: Patient tearful during exam.   HENT:     Head: Normocephalic.  Pulmonary:     Effort: Pulmonary effort is normal.  Abdominal:     Palpations: Abdomen is soft.     Tenderness: There is no abdominal tenderness. There is no right CVA tenderness or left CVA tenderness.  Genitourinary:    Vagina: Vaginal discharge present.     Cervix: Friability present.     Comments: Greenish-brown discharge noted in vaginal vault.   Bright red bleeding noted when cervix was manipulated. No clots observed or tissue in the os of the cervix.  Musculoskeletal:     Cervical back: Normal range of motion.  Skin:    General: Skin is warm and dry.  Neurological:     Mental Status: She is alert and oriented to person, place, and time.  Psychiatric:        Mood and Affect: Mood normal.     MAU Course  Procedures Orders Placed This Encounter  Procedures   Wet prep, genital   US OB LESS THAN 14 WEEKS WITH OB TRANSVAGINAL   Basic metabolic panel   CBC   Urinalysis, Routine w reflex microscopic Urine, Clean Catch   hCG, quantitative, pregnancy   HIV Antibody (routine testing w rflx)   RPR   Document Height and Actual Weight   I-Stat beta hCG blood, ED   ED EKG   Results for orders placed or performed during the hospital encounter of 08/05/22 (from the past 24 hour(s))  Basic metabolic panel     Status: Abnormal   Collection Time: 08/05/22  9:09 AM  Result Value Ref Range   Sodium 140 135 - 145 mmol/L   Potassium 3.5 3.5 - 5.1 mmol/L   Chloride 102 98 - 111 mmol/L   CO2 22 22 - 32 mmol/L   Glucose, Bld 82 70 - 99 mg/dL   BUN 5 (L) 6 - 20 mg/dL   Creatinine, Ser 0.63 0.44 - 1.00 mg/dL   Calcium 9.6 8.9 - 10.3 mg/dL   GFR, Estimated >60 >60 mL/min   Anion gap 16 (H) 5 - 15  CBC     Status: None   Collection Time: 08/05/22   9:09 AM  Result Value Ref Range   WBC 5.5 4.0 - 10.5 K/uL   RBC 4.43 3.87 - 5.11 MIL/uL  Hemoglobin 12.9 12.0 - 15.0 g/dL   HCT 39.5 36.0 - 46.0 %   MCV 89.2 80.0 - 100.0 fL   MCH 29.1 26.0 - 34.0 pg   MCHC 32.7 30.0 - 36.0 g/dL   RDW 12.5 11.5 - 15.5 %   Platelets 261 150 - 400 K/uL   nRBC 0.0 0.0 - 0.2 %  Troponin I (High Sensitivity)     Status: None   Collection Time: 08/05/22  9:09 AM  Result Value Ref Range   Troponin I (High Sensitivity) <2 <18 ng/L  I-Stat beta hCG blood, ED     Status: Abnormal   Collection Time: 08/05/22  9:13 AM  Result Value Ref Range   I-stat hCG, quantitative >2,000.0 (H) <5 mIU/mL   Comment 3          hCG, quantitative, pregnancy     Status: Abnormal   Collection Time: 08/05/22  9:59 AM  Result Value Ref Range   hCG, Beta Chain, Quant, S 65,268 (H) <5 mIU/mL  Troponin I (High Sensitivity)     Status: None   Collection Time: 08/05/22  9:59 AM  Result Value Ref Range   Troponin I (High Sensitivity) <2 <18 ng/L  Wet prep, genital     Status: Abnormal   Collection Time: 08/05/22 10:54 AM   Specimen: PATH Cytology Cervicovaginal Ancillary Only  Result Value Ref Range   Yeast Wet Prep HPF POC PRESENT (A) NONE SEEN   Trich, Wet Prep NONE SEEN NONE SEEN   Clue Cells Wet Prep HPF POC NONE SEEN NONE SEEN   WBC, Wet Prep HPF POC >=10 (A) <10   Sperm NONE SEEN    US OB LESS THAN 14 WEEKS WITH OB TRANSVAGINAL  Result Date: 08/05/2022 CLINICAL DATA:  Vaginal bleeding affecting first trimester of pregnancy EXAM: OBSTETRIC <14 WK [place] AND TRANSVAGINAL OB US TECHNIQUE: Both transabdominal and transvaginal ultrasound examinations were performed for complete evaluation of the gestation as well as the maternal uterus, adnexal regions, and pelvic cul-de-sac. Transvaginal technique was performed to assess early pregnancy. COMPARISON:  None Available. FINDINGS: Intrauterine gestational sac: Present, single Yolk sac:  Present Embryo:  Present Cardiac Activity: Present  Heart Rate: 169 bpm CRL:  16.1 mm   8 w   0 d. Small subchronic hemorrhage.  [place] EDC: 03/17/2023 Subchorionic hemorrhage:  Small subchorionic hemorrhage Maternal uterus/adnexae: Anteverted uterus, otherwise unremarkable. RIGHT ovary measures 2.5 x 2.5 x 2.1 cm and contains a small corpus luteum. LEFT ovary normal size and morphology, 3.1 x 1.5 x 2.2 cm. No free pelvic fluid or adnexal masses. IMPRESSION: Single live intrauterine gestation at 8 weeks 0 days EGA by crown-rump length Electronically Signed   By: Lavonia Dana M.D.   On: 08/05/2022 12:02    Lab results reviewed and interpreted by me.   MDM US revealed a living IUP measuring 8 weeks with cardiac activity.  - Subchorionic hemorrhage noted on [place].  - Wet prep positive for YEAST  - GC Pending Upon discharge  - CBC from ED normal and patient hemodynamically stable  - Plan for discharge   Assessment and Plan   1. Abdominal pain during pregnancy in first trimester   2. Yeast infection   3. [redacted] weeks gestation of pregnancy   4. Subchorionic hemorrhage of placenta in first trimester, single or unspecified fetus    - Discussed that this is a normal IUP and bleeding likely coming from the subchorionic hemorrhage of pregnancy.  - Patient desires termination. Pregnancy  verification letter and resources provided. Reminded patient of change in [place] laws and noted that her window is quickly approaching. Patient verbalized understanding - Strict bleeding, worsening signs and return precautions reviewed.  - Rx for [employer] 7 sent to outpatient pharmacy. Admin instructions provided at patient bedside.  - Patient discharged home in stable condition and may return to MAU as needed.   Jacquiline Doe, MSN CNM  08/05/2022, 10:30 AM

## 2022-08-05 NOTE — ED Triage Notes (Signed)
Patient here with complaint of chest pain described as a sharp pressure in the center of her chest that started at 0800 this morning, also complains of vaginal bleeding that started yesterday. Patient states she is approximately seven weeks pregnant. Patient is alert, oriented, ambulating independently with steady gait. Patient reports some mild nausea and dizziness.

## 2022-08-05 NOTE — Discharge Instructions (Addendum)
[facility] Address: [place]. [address] [place], [place] [address] Hours:  Monday 9AM-5PM Tuesday 10AM-6PM Wednesday 11AM-7PM Thursday 9AM-5PM Friday              8AM-2PM Saturday Sunday  Phone: [phone]

## 2022-08-06 LAB — GC/CHLAMYDIA PROBE AMP (~~LOC~~) NOT AT ARMC
Chlamydia: NEGATIVE
Comment: NEGATIVE
Comment: NORMAL
Neisseria Gonorrhea: NEGATIVE

## 2022-08-19 ENCOUNTER — Telehealth: Payer: BC Managed Care – PPO | Admitting: Obstetrics and Gynecology

## 2022-08-23 NOTE — Progress Notes (Signed)
Established patient visit   Patient: Amber Dominguez   DOB: May 24, 1991   31 y.o. Female  MRN: [id number] Visit Date: 08/24/2022   Chief Complaint  Patient presents with   Follow-up   Subjective    HPI  Follow up -IBS with diarrhea -  --ultrasound showing contracted gallbladder with gallstones  -colonoscopy and endoscopy done 06/10/2022 showing single polyp which was benign but precancerous. Repeat colonoscopy in 5 years. Normal upper endoscopy. Patient is a patient at [facility]. She is having persistent RUQ pain after normal endoscopy. Single polyp removed during colonoscopy. HIDA scan was recommended if RUQ pain was persistent and endoscopy was normal.  -patient states that she did have improvement of abdominal pain initially after colonoscopy. She states that pain is gradually becoming more frequent and more severe again.  -recently positive for pregnancy --hospital admission with cramping and vaginal bleeding  --[redacted] weeks gestation on 08/05/2022  --patient dd take pills to terminate her pregnancy.     Medications: Outpatient Medications Prior to Visit  Medication Sig   dicyclomine (BENTYL) 20 MG tablet Take 20 mg by mouth every 6 (six) hours. (Patient not taking: Reported on 08/24/2022)   terconazole (TERAZOL 7) 0.4 % vaginal cream Place 1 applicator vaginally at bedtime. Use for seven days   [DISCONTINUED] omeprazole (PRILOSEC) 20 MG capsule Take 1 capsule (20 mg total) by mouth daily before breakfast. (Patient not taking: Reported on 07/29/2022)   No facility-administered medications prior to visit.    Review of Systems  Constitutional:  Positive for appetite change and fatigue. Negative for activity change, chills and fever.  HENT:  Negative for congestion, postnasal drip, rhinorrhea, sinus pressure, sinus pain, sneezing and sore throat.   Eyes: Negative.   Respiratory:  Negative for cough, chest tightness, shortness of breath and wheezing.   Cardiovascular:  Negative  for chest pain and palpitations.  Gastrointestinal:  Positive for abdominal pain, constipation, diarrhea and nausea. Negative for vomiting.  Endocrine: Negative for cold intolerance, heat intolerance, polydipsia and polyuria.  Genitourinary:  Negative for dyspareunia, dysuria, flank pain, frequency and urgency.  Musculoskeletal:  Negative for arthralgias, back pain and myalgias.  Skin:  Negative for rash.  Allergic/Immunologic: Negative for environmental allergies.  Neurological:  Negative for dizziness, weakness and headaches.  Hematological:  Negative for adenopathy.  Psychiatric/Behavioral:  Positive for dysphoric mood. The patient is nervous/anxious.        Objective     Today's Vitals   08/24/22 1534  BP: 117/77  Pulse: 76  Resp: 18  SpO2: 100%  Weight: 212 lb (96.2 kg)  Height: 5' 4.96" (1.65 m)   Body mass index is 35.32 kg/m.  BP Readings from Last 3 Encounters:  08/24/22 117/77  08/05/22 131/63  07/29/22 137/84    Wt Readings from Last 3 Encounters:  08/24/22 212 lb (96.2 kg)  08/05/22 212 lb 1.6 oz (96.2 kg)  07/29/22 214 lb (97.1 kg)    Physical Exam Vitals and nursing note reviewed.  Constitutional:      Appearance: Normal appearance. She is well-developed.  HENT:     Head: Normocephalic and atraumatic.  Eyes:     Pupils: Pupils are equal, round, and reactive to light.  Cardiovascular:     Rate and Rhythm: Normal rate and regular rhythm.     Pulses: Normal pulses.     Heart sounds: Normal heart sounds.  Pulmonary:     Effort: Pulmonary effort is normal.     Breath sounds: Normal breath sounds.  Abdominal:  General: Bowel sounds are normal. There is no distension.     Palpations: Abdomen is soft. There is no mass.     Tenderness: There is abdominal tenderness. There is no right CVA tenderness, left CVA tenderness, guarding or rebound.     Hernia: No hernia is present.     Comments: Mild, generalized abdominal tenderness.   Musculoskeletal:         General: Normal range of motion.     Cervical back: Normal range of motion and neck supple.  Lymphadenopathy:     Cervical: No cervical adenopathy.  Skin:    General: Skin is warm and dry.     Capillary Refill: Capillary refill takes less than 2 seconds.  Neurological:     General: No focal deficit present.     Mental Status: She is alert and oriented to person, place, and time.  Psychiatric:        Attention and Perception: Attention and perception normal.        Mood and Affect: Affect normal. Mood is depressed.        Speech: Speech normal.        Behavior: Behavior normal. Behavior is cooperative.        Thought Content: Thought content normal.        Cognition and Memory: Cognition and memory normal.        Judgment: Judgment normal.       Assessment & Plan    1. Calculus of gallbladder with acute on chronic cholecystitis without obstruction Ultrasound done 02/2022 did show the  following: -Linear echogenicity in the gallbladder fossa with posterior acoustic shadowing. This "wall echo shadow" complex is most commonly seen with a contracted, stone filled gallbladder as in chronic cholecystitis. No evidence of acute superimposed inflammation.  Moderate hepatic steatosis Refer to GI and possibly, surgery, for further evaluation and treatment.  - Ambulatory referral to Gastroenterology  2. Epigastric pain Has returned and is now  worse than previous symptoms. Refer to GI for further evaluation.  - Ambulatory referral to Gastroenterology  3. Irritable bowel syndrome with diarrhea Addition of sertraline did help symptoms in the past. Restart this today at 25 mg daily. Reassess In 3 months  - sertraline (ZOLOFT) 25 MG tablet; Take 1 tablet (25 mg total) by mouth daily.  Dispense: 30 tablet; Refill: 3  4. Premenstrual dysphoric disorder Restart sertraline 25 mg daily. Reassess in 3 months.  - sertraline (ZOLOFT) 25 MG tablet; Take 1 tablet (25 mg total) by mouth daily.   Dispense: 30 tablet; Refill: 3    Problem List Items Addressed This Visit       Digestive   Irritable bowel syndrome with diarrhea   Relevant Medications   sertraline (ZOLOFT) 25 MG tablet   Calculus of gallbladder with acute on chronic cholecystitis without obstruction - Primary   Relevant Orders   Ambulatory referral to Gastroenterology     Other   Epigastric pain   Relevant Orders   Ambulatory referral to Gastroenterology   Premenstrual dysphoric disorder   Relevant Medications   sertraline (ZOLOFT) 25 MG tablet     Return in about 3 months (around 11/23/2022) for mood.         Ronnell Freshwater, NP  [facility] at [facility] [phone] (phone) [phone] (fax)  [place]

## 2022-08-24 ENCOUNTER — Ambulatory Visit (INDEPENDENT_AMBULATORY_CARE_PROVIDER_SITE_OTHER): Payer: BC Managed Care – PPO | Admitting: Nurse Practitioner

## 2022-08-24 ENCOUNTER — Other Ambulatory Visit: Payer: Self-pay | Admitting: Nurse Practitioner

## 2022-08-24 ENCOUNTER — Encounter: Payer: Self-pay | Admitting: Nurse Practitioner

## 2022-08-24 VITALS — BP 117/77 | HR 76 | Resp 18 | Ht 64.96 in | Wt 212.0 lb

## 2022-08-24 DIAGNOSIS — K8012 Calculus of gallbladder with acute and chronic cholecystitis without obstruction: Secondary | ICD-10-CM | POA: Diagnosis not present

## 2022-08-24 DIAGNOSIS — R1013 Epigastric pain: Secondary | ICD-10-CM | POA: Diagnosis not present

## 2022-08-24 DIAGNOSIS — F3281 Premenstrual dysphoric disorder: Secondary | ICD-10-CM

## 2022-08-24 DIAGNOSIS — K580 Irritable bowel syndrome with diarrhea: Secondary | ICD-10-CM

## 2022-08-24 MED ORDER — SERTRALINE HCL 25 MG PO TABS: 25.0000 mg | ORAL_TABLET | Freq: Every day | ORAL | 3 refills | Status: DC

## 2022-08-25 DIAGNOSIS — M4723 Other spondylosis with radiculopathy, cervicothoracic region: Secondary | ICD-10-CM | POA: Diagnosis not present

## 2022-08-25 DIAGNOSIS — M47816 Spondylosis without myelopathy or radiculopathy, lumbar region: Secondary | ICD-10-CM | POA: Diagnosis not present

## 2022-09-11 ENCOUNTER — Encounter: Payer: BC Managed Care – PPO | Admitting: Obstetrics and Gynecology

## 2022-10-22 ENCOUNTER — Telehealth: Payer: Self-pay

## 2022-10-22 ENCOUNTER — Other Ambulatory Visit: Payer: Self-pay | Admitting: Nurse Practitioner

## 2022-10-22 DIAGNOSIS — Z30011 Encounter for initial prescription of contraceptive pills: Secondary | ICD-10-CM

## 2022-10-22 MED ORDER — NORGESTIMATE-ETH ESTRADIOL 0.25-35 MG-MCG PO TABS: 1.0000 | ORAL_TABLET | Freq: Every day | ORAL | 1 refills | Status: DC

## 2022-10-22 NOTE — Telephone Encounter (Signed)
I sent a new prescription for the ortho-cyclen. This was the most recent birth control pill she was on. [facility] sent [facility] to [facility]

## 2022-10-22 NOTE — Telephone Encounter (Signed)
Pt sent a MyChart message wanting to know if provider is willing to:  I was wondering if Amber Dominguez could prescribe me my birth control again? The Norg-[employer]in Estra. I have less than a month left and I want to find another gyn because my last one left femina again.    If so Pharmacy is[id number] [address]   LOV 08/24/22 ROV 11/24/22

## 2022-11-23 ENCOUNTER — Ambulatory Visit: Payer: BC Managed Care – PPO | Admitting: Nurse Practitioner

## 2022-11-24 ENCOUNTER — Ambulatory Visit (INDEPENDENT_AMBULATORY_CARE_PROVIDER_SITE_OTHER): Payer: Managed Care, Other (non HMO) | Admitting: Nurse Practitioner

## 2022-11-24 ENCOUNTER — Encounter: Payer: Self-pay | Admitting: Nurse Practitioner

## 2022-11-24 VITALS — BP 127/75 | HR 75 | Ht 64.96 in | Wt 209.9 lb

## 2022-11-24 DIAGNOSIS — R1013 Epigastric pain: Secondary | ICD-10-CM

## 2022-11-24 DIAGNOSIS — K580 Irritable bowel syndrome with diarrhea: Secondary | ICD-10-CM

## 2022-11-24 DIAGNOSIS — K8012 Calculus of gallbladder with acute and chronic cholecystitis without obstruction: Secondary | ICD-10-CM

## 2022-11-24 NOTE — Progress Notes (Signed)
Established patient visit   Patient: Amber Dominguez   DOB: March 31, 1991   32 y.o. Female  MRN: [id number] Visit Date: 11/24/2022   Chief Complaint  Patient presents with   Medical Management of Chronic Issues   Subjective    HPI  Follow up  -referral made to GI due to calculus of gallbladder. Along with intermittent RUQ pain  -was referred to [facility]. She never heard from them to schedule appointment.  -will make new referral to [facility].  -mood/IBS - improved on sertraline 25 mg daily.  This was unchanged at her most recent visit.  -blood pressure slightly elevated at the start of today's visit  --She denies chest pain, chest pressure, or shortness of breath. She denies headaches or visual disturbances.    Medications: Outpatient Medications Prior to Visit  Medication Sig   norgestimate-ethinyl estradiol (ORTHO-CYCLEN) 0.25-35 MG-MCG tablet Take 1 tablet by mouth daily.   terconazole (TERAZOL 7) 0.4 % vaginal cream Place 1 applicator vaginally at bedtime. Use for seven days   [DISCONTINUED] sertraline (ZOLOFT) 25 MG tablet Take 1 tablet (25 mg total) by mouth daily.   [DISCONTINUED] dicyclomine (BENTYL) 20 MG tablet Take 20 mg by mouth every 6 (six) hours. (Patient not taking: Reported on 08/24/2022)   No facility-administered medications prior to visit.    Review of Systems See HPI    Last CBC Lab Results  Component Value Date   WBC 5.5 08/05/2022   HGB 12.9 08/05/2022   HCT 39.5 08/05/2022   MCV 89.2 08/05/2022   MCH 29.1 08/05/2022   RDW 12.5 08/05/2022   PLT 261 08/05/2022   Last metabolic panel Lab Results  Component Value Date   GLUCOSE 82 08/05/2022   NA 140 08/05/2022   K 3.5 08/05/2022   CL 102 08/05/2022   CO2 22 08/05/2022   BUN 5 (L) 08/05/2022   CREATININE 0.63 08/05/2022   GFRNONAA >60 08/05/2022   CALCIUM 9.6 08/05/2022   PROT 7.2 03/02/2022   ALBUMIN 4.3 03/02/2022   LABGLOB 2.9 03/02/2022   AGRATIO 1.5 03/02/2022   BILITOT 0.2  03/02/2022   ALKPHOS 72 03/02/2022   AST 15 03/02/2022   ALT 14 03/02/2022   ANIONGAP 16 (H) 08/05/2022   Last lipids Lab Results  Component Value Date   CHOL 148 03/02/2022   HDL 47 03/02/2022   LDLCALC 70 03/02/2022   TRIG 187 (H) 03/02/2022   CHOLHDL 3.1 03/02/2022   Last hemoglobin A1c Lab Results  Component Value Date   HGBA1C 5.2 03/03/2022   Last thyroid functions Lab Results  Component Value Date   TSH 1.470 03/02/2022   Last vitamin D Lab Results  Component Value Date   VD25OH 39.8 03/02/2022       Objective     Today's Vitals   11/24/22 1445 11/24/22 1512  BP: (Abnormal) 141/78 127/75  Pulse: 75   SpO2: 99%   Weight: 209 lb 14.4 oz (95.2 kg)   Height: 5' 4.96" (1.65 m)    Body mass index is 34.97 kg/m.  BP Readings from Last 3 Encounters:  11/24/22 127/75  08/24/22 117/77  08/05/22 131/63    Wt Readings from Last 3 Encounters:  11/24/22 209 lb 14.4 oz (95.2 kg)  08/24/22 212 lb (96.2 kg)  08/05/22 212 lb 1.6 oz (96.2 kg)    Physical Exam Vitals and nursing note reviewed.  Constitutional:      Appearance: Normal appearance. She is well-developed.  HENT:     Head: Normocephalic  and atraumatic.  Eyes:     Pupils: Pupils are equal, round, and reactive to light.  Cardiovascular:     Rate and Rhythm: Normal rate and regular rhythm.     Pulses: Normal pulses.     Heart sounds: Normal heart sounds.  Pulmonary:     Effort: Pulmonary effort is normal.     Breath sounds: Normal breath sounds.  Abdominal:     General: Bowel sounds are normal. There is no distension.     Palpations: Abdomen is soft. There is no mass.     Tenderness: There is abdominal tenderness. There is no right CVA tenderness, left CVA tenderness, guarding or rebound.     Hernia: No hernia is present.     Comments: Mild, generalized abdominal tenderness.   Musculoskeletal:        General: Normal range of motion.     Cervical back: Normal range of motion and neck supple.   Lymphadenopathy:     Cervical: No cervical adenopathy.  Skin:    General: Skin is warm and dry.     Capillary Refill: Capillary refill takes less than 2 seconds.  Neurological:     General: No focal deficit present.     Mental Status: She is alert and oriented to person, place, and time.  Psychiatric:        Attention and Perception: Attention and perception normal.        Mood and Affect: Affect normal. Mood is depressed.        Speech: Speech normal.        Behavior: Behavior normal. Behavior is cooperative.        Thought Content: Thought content normal.        Cognition and Memory: Cognition and memory normal.        Judgment: Judgment normal.       Assessment & Plan     Problem List Items Addressed This Visit       Digestive   Irritable bowel syndrome with diarrhea - Primary    Improved with sertraline 25 mg daily. Continue as prescribed. New referral to GI today       Calculus of gallbladder with acute on chronic cholecystitis without obstruction    Symptoms improved and no longer acute. Referral to new GI provider for further evaluation and management.       Relevant Orders   Ambulatory referral to Gastroenterology     Other   Epigastric pain    Improved. New referral to GI provider made today.       Relevant Orders   Ambulatory referral to Gastroenterology     Return in about 6 months (around 05/27/2023) for health maintenance exam, FBW a week prior to visit.         Carlean Jews, NP  [facility] at [facility] [phone] (phone) [phone] (fax)  [facility]

## 2022-12-18 ENCOUNTER — Other Ambulatory Visit: Payer: Self-pay | Admitting: Nurse Practitioner

## 2022-12-18 DIAGNOSIS — K580 Irritable bowel syndrome with diarrhea: Secondary | ICD-10-CM

## 2022-12-18 DIAGNOSIS — F3281 Premenstrual dysphoric disorder: Secondary | ICD-10-CM

## 2022-12-27 NOTE — Assessment & Plan Note (Signed)
Improved. New referral to GI provider made today.

## 2022-12-27 NOTE — Assessment & Plan Note (Signed)
Improved with sertraline 25 mg daily. Continue as prescribed. New referral to GI today

## 2022-12-27 NOTE — Assessment & Plan Note (Signed)
Symptoms improved and no longer acute. Referral to new GI provider for further evaluation and management.

## 2023-03-08 ENCOUNTER — Encounter: Payer: Self-pay | Admitting: Nurse Practitioner

## 2023-03-08 ENCOUNTER — Ambulatory Visit (INDEPENDENT_AMBULATORY_CARE_PROVIDER_SITE_OTHER): Payer: Managed Care, Other (non HMO) | Admitting: Nurse Practitioner

## 2023-03-08 VITALS — BP 118/82 | HR 68 | Ht 65.0 in | Wt 210.0 lb

## 2023-03-08 DIAGNOSIS — R197 Diarrhea, unspecified: Secondary | ICD-10-CM

## 2023-03-08 DIAGNOSIS — R140 Abdominal distension (gaseous): Secondary | ICD-10-CM

## 2023-03-08 DIAGNOSIS — R1013 Epigastric pain: Secondary | ICD-10-CM

## 2023-03-08 DIAGNOSIS — R1012 Left upper quadrant pain: Secondary | ICD-10-CM | POA: Diagnosis not present

## 2023-03-08 MED ORDER — GLYCOPYRROLATE 2 MG PO TABS: 2.0000 mg | ORAL_TABLET | Freq: Two times a day (BID) | ORAL | 2 refills | Status: DC | PRN

## 2023-03-08 NOTE — Patient Instructions (Addendum)
_______________________________________________________  If your blood pressure at your visit was 140/90 or greater, please contact your primary care physician to follow up on this.  If you are age 33 or younger, your body mass index should be between 19-25. Your Body mass index is 34.95 kg/m. If this is out of the aformentioned range listed, please consider follow up with your Primary Care Provider.  ________________________________________________________  The [place] [facility] providers would like to encourage you to use [facility] to communicate with providers for non-urgent requests or questions.  Due to long hold times on the telephone, sending your provider a message by [facility] may be a faster and more efficient way to get a response.  Please allow 48 business hours for a response.  Please remember that this is for non-urgent requests.  _______________________________________________________  We have sent the following medications to your pharmacy for you to pick up at your convenience:  START: Robinul Forte 2mg  take one tablet two times daily as needed for abdominal pain.   You are scheduled to follow up with Dr Lavon Paganini on 07-13-23 at 8:30am.  Thank you for entrusting me with your care and choosing [facility].  Gunnar Fusi, NP

## 2023-03-08 NOTE — Progress Notes (Addendum)
Primary GI: Marsa Aris, MD  Assessment and Plan   Brief Narrative:  32 y.o.  female whose past medical history includes,  but is not necessarily limited to, colon polyps (sessile serrated), hepatic steatosis  Ongoing frequent episodes of postprandial abdominal bloating, epigastric / LUQ pain and diarrhea. Bloating and pain often improve after BM.  Food intolerance? IBS? Chronic cholecystitis?   EGD and colonoscopy last year for same symptoms were unremarkable.  RUQ [place] suggesting cholelithiasis and possible chronic cholecystitis.  -Trial of Robinul forte BID as needed for abdominal pain -Keep a food diary to look for culprit foods / drinks  -Since having some normal, formed stools I don't want to give anti-diarrheal agent as it may constipate her -Follow up with Dr. Lavon Paganini in October ( first available) . In the interim Dr. Lavon Paganini may decide to proceed with a HIDA and / or surgical evaluation though involvement of LUQ is atypical for biliary pain.   Addendum: Going to go ahead and get a HIDA    History of Present Illness   Chief complaint:  abdominal pain , bloating, intermittent diarrhea  Patient was last seen September 2023 for evaluation of postprandial abdominal pain, nausea, bowel changes, unintentional weight loss.  EGD and colonoscopy were unrevealing.  PCP recently sent her for RUQ ultrasound which suggested that she had a stone filled gallbladder which could be seen in chronic cholecystitis  Amber Dominguez still has postprandial epigastric pain sometimes radiating to LUQ pain . She denies having any RUQ pain. Sometimes the pain is associated with nausea.  She has frequent abdominal bloating. Additionally she has postprandial diarrhea about 2-3 times a week. When diarrhea starts she may have multiple episodes. She eats high fiber diet. She hasn't tried to omit diary but has reduced intake without noticeable difference in symptoms. Coffee sometimes triggers the pain and diarrhea  but other than that she can't think of any specific food triggers. She does have normal , formed BMs a couple of times a week.  She has tried something PCP prescribed for the pain ( ? Bentyl) but  it made her foggy and didn't help the pain.   Her weight is stable.   Previous GI Endoscopies / Labs / Imaging   Sept 2023 EGD for epigastric pain and weight loss -Normal  Sept 2023 colonoscopy for abdominal pain, bowel changes and weight loss -One 5 mm polyp in the transverse colon was removed.  Normal mucosa in the entire colon.  Nonbleeding external and internal hemorrhoids.   Diagnosis Surgical [P], colon, transverse, polyp (1) - SESSILE SERRATED POLYP WITHOUT CYTOLOGIC DYSPLASIA  03/13/2022 RUQ ultrasound -IMPRESSION: Linear echogenicity in the gallbladder fossa with posterior acoustic shadowing. This "wall echo shadow" complex is most commonly seen with a contracted, stone filled gallbladder as in chronic cholecystitis. No evidence of acute superimposed inflammation   Past Medical History:  Diagnosis Date   Anxiety 2023   Chlamydia    Chronic hypertension complicating pregnancy, antepartum 06/28/2017   [ X] Aspirin 81 mg daily after 12 weeks; discontinue after 36 weeks Current antihypertensives:  None   Baseline and surveillance labs (pulled in from [facility], refresh links as needed)  Lab Results  Component Value Date   PLT 246 05/19/2017    Antenatal Testing CHTN - O10.919  Group I  BP < 140/90, no preeclampsia, AGA,  nml AFV, +/- meds    Group II BP > 140/90, on meds, no preeclampsia, AGA, nml AFV   Headache    High  blood pressure 05/2017   Hypertension     Past Surgical History:  Procedure Laterality Date   TONSILLECTOMY     WISDOM TOOTH EXTRACTION      Current Medications, Allergies, Family History and Social History were reviewed in [employer] record.     Current Outpatient Medications  Medication Sig Dispense Refill   norgestimate-ethinyl estradiol  (ORTHO-CYCLEN) 0.25-35 MG-MCG tablet Take 1 tablet by mouth daily. 84 tablet 1   sertraline (ZOLOFT) 25 MG tablet TAKE 1 TABLET (25 MG TOTAL) BY MOUTH DAILY. 90 tablet 1   No current facility-administered medications for this visit.    Review of Systems: No chest pain. No shortness of breath. No urinary complaints.    Physical Exam  Wt Readings from Last 3 Encounters:  03/08/23 210 lb (95.3 kg)  11/24/22 209 lb 14.4 oz (95.2 kg)  08/24/22 212 lb (96.2 kg)    BP 118/82   Pulse 68   Ht 5\' 5"  (1.651 m)   Wt 210 lb (95.3 kg)   LMP 06/11/2022   SpO2 98%   BMI 34.95 kg/m  Constitutional:  Pleasant, generally well appearing female in no acute distress. Psychiatric: Normal mood and affect. Behavior is normal. EENT: Pupils normal.  Conjunctivae are normal. No scleral icterus. Neck supple.  Cardiovascular: Normal rate, regular rhythm.  Pulmonary/chest: Effort normal and breath sounds normal. No wheezing, rales or rhonchi. Abdominal: Soft, nondistended, nontender. Bowel sounds active throughout. There are no masses palpable. No hepatomegaly. Neurological: Alert and oriented to person place and time.   Willette Cluster, NP  03/08/2023, 2:51 PM

## 2023-03-10 ENCOUNTER — Telehealth: Payer: Self-pay

## 2023-03-10 DIAGNOSIS — R935 Abnormal findings on diagnostic imaging of other abdominal regions, including retroperitoneum: Secondary | ICD-10-CM

## 2023-03-10 DIAGNOSIS — R1010 Upper abdominal pain, unspecified: Secondary | ICD-10-CM

## 2023-03-10 NOTE — Telephone Encounter (Signed)
Patient aware that she has been scheduled for a HIDA scan at [facility] (1st floor) on 03-30-23. Patient advised to arrive 15 minutes prior to your scheduled appointment at  7:30am. She was advised to make certain not to have anything to eat or drink at least 6 hours prior to test.   Patient agreed to plan and verbalized understanding.  No further questions.

## 2023-03-10 NOTE — Telephone Encounter (Signed)
-----   Message from Meredith Pel, NP sent at 03/08/2023  4:52 PM EDT ----- Joni Reining,  Will you schedule this patient for a HIDA scan please? I told her one of the docs would look at her [place] and see if they agree that HIDA makes sense. Diagnosis is upper abdominal pain and abnormal RUQ [place] suggesting cholelithiasis and possible chronic cholecystitis.  I will try and call the patient now and let her know that this will be scheduled soon.  Thanks

## 2023-03-18 ENCOUNTER — Other Ambulatory Visit: Payer: Self-pay | Admitting: Nurse Practitioner

## 2023-03-23 NOTE — Progress Notes (Signed)
Reviewed and agree with documentation and assessment and plan. K. Veena Jahron Hunsinger , MD   

## 2023-03-30 ENCOUNTER — Encounter (HOSPITAL_COMMUNITY): Payer: Managed Care, Other (non HMO)

## 2023-04-15 ENCOUNTER — Encounter (HOSPITAL_COMMUNITY)
Admission: RE | Admit: 2023-04-15 | Discharge: 2023-04-15 | Disposition: A | Discharge status: Home / Self Care | Payer: Managed Care, Other (non HMO) | Source: Ambulatory Visit | Attending: Nurse Practitioner | Admitting: Nurse Practitioner

## 2023-04-15 DIAGNOSIS — R1010 Upper abdominal pain, unspecified: Secondary | ICD-10-CM | POA: Insufficient documentation

## 2023-04-15 DIAGNOSIS — R1011 Right upper quadrant pain: Secondary | ICD-10-CM | POA: Diagnosis not present

## 2023-04-15 DIAGNOSIS — R935 Abnormal findings on diagnostic imaging of other abdominal regions, including retroperitoneum: Secondary | ICD-10-CM | POA: Diagnosis present

## 2023-04-15 MED ORDER — TECHNETIUM TC 99M MEBROFENIN IV KIT
5.3200 | PACK | Freq: Once | INTRAVENOUS | Status: AC
  Administered 2023-04-15: 5.32 via INTRAVENOUS

## 2023-05-04 ENCOUNTER — Telehealth: Payer: Self-pay | Admitting: *Deleted

## 2023-05-04 NOTE — Telephone Encounter (Signed)
LVM for pt to call office to see if she would like to reschedule with one of our current providers. Will also send mychart message.

## 2023-05-09 ENCOUNTER — Other Ambulatory Visit: Payer: Self-pay | Admitting: Nurse Practitioner

## 2023-05-09 DIAGNOSIS — Z30011 Encounter for initial prescription of contraceptive pills: Secondary | ICD-10-CM

## 2023-05-25 ENCOUNTER — Other Ambulatory Visit: Payer: Managed Care, Other (non HMO)

## 2023-05-31 ENCOUNTER — Ambulatory Visit: Payer: Managed Care, Other (non HMO) | Admitting: Nurse Practitioner

## 2023-06-13 ENCOUNTER — Other Ambulatory Visit: Payer: Self-pay | Admitting: Nurse Practitioner

## 2023-06-13 DIAGNOSIS — K580 Irritable bowel syndrome with diarrhea: Secondary | ICD-10-CM

## 2023-06-13 DIAGNOSIS — F3281 Premenstrual dysphoric disorder: Secondary | ICD-10-CM

## 2023-06-14 MED ORDER — SERTRALINE HCL 25 MG PO TABS: 25.0000 mg | ORAL_TABLET | Freq: Every day | ORAL | 1 refills | Status: DC

## 2023-06-22 ENCOUNTER — Other Ambulatory Visit: Payer: Self-pay

## 2023-06-22 DIAGNOSIS — Z0000 Encounter for general adult medical examination without abnormal findings: Secondary | ICD-10-CM

## 2023-06-23 ENCOUNTER — Other Ambulatory Visit: Payer: Managed Care, Other (non HMO)

## 2023-06-23 DIAGNOSIS — Z0000 Encounter for general adult medical examination without abnormal findings: Secondary | ICD-10-CM

## 2023-06-24 LAB — CBC WITH DIFFERENTIAL/PLATELET
Basophils Absolute: 0 10*3/uL (ref 0.0–0.2)
Basos: 0 %
EOS (ABSOLUTE): 0.3 10*3/uL (ref 0.0–0.4)
Eos: 3 %
Hematocrit: 40.6 % (ref 34.0–46.6)
Hemoglobin: 13.1 g/dL (ref 11.1–15.9)
Immature Grans (Abs): 0 10*3/uL (ref 0.0–0.1)
Immature Granulocytes: 0 %
Lymphocytes Absolute: 1.9 10*3/uL (ref 0.7–3.1)
Lymphs: 18 %
MCH: 28.9 pg (ref 26.6–33.0)
MCHC: 32.3 g/dL (ref 31.5–35.7)
MCV: 89 fL (ref 79–97)
Monocytes Absolute: 0.5 10*3/uL (ref 0.1–0.9)
Monocytes: 5 %
Neutrophils Absolute: 7.3 10*3/uL — ABNORMAL HIGH (ref 1.4–7.0)
Neutrophils: 74 %
Platelets: 293 10*3/uL (ref 150–450)
RBC: 4.54 x10E6/uL (ref 3.77–5.28)
RDW: 12 % (ref 11.7–15.4)
WBC: 10.1 10*3/uL (ref 3.4–10.8)

## 2023-06-24 LAB — COMPREHENSIVE METABOLIC PANEL
ALT: 14 [IU]/L (ref 0–32)
AST: 14 [IU]/L (ref 0–40)
Albumin: 4.2 g/dL (ref 3.9–4.9)
Alkaline Phosphatase: 76 [IU]/L (ref 44–121)
BUN/Creatinine Ratio: 13 (ref 9–23)
BUN: 10 mg/dL (ref 6–20)
Bilirubin Total: 0.4 mg/dL (ref 0.0–1.2)
CO2: 22 mmol/L (ref 20–29)
Calcium: 9.2 mg/dL (ref 8.7–10.2)
Chloride: 103 mmol/L (ref 96–106)
Creatinine, Ser: 0.77 mg/dL (ref 0.57–1.00)
Globulin, Total: 3.1 g/dL (ref 1.5–4.5)
Glucose: 71 mg/dL (ref 70–99)
Potassium: 4.3 mmol/L (ref 3.5–5.2)
Sodium: 140 mmol/L (ref 134–144)
Total Protein: 7.3 g/dL (ref 6.0–8.5)
eGFR: 104 mL/min/{1.73_m2} (ref >59)

## 2023-06-24 LAB — LIPID PANEL
Chol/HDL Ratio: 2.6 {ratio} (ref 0.0–4.4)
Cholesterol, Total: 161 mg/dL (ref 100–199)
HDL: 62 mg/dL (ref >39)
LDL Chol Calc (NIH): 74 mg/dL (ref 0–99)
Triglycerides: 149 mg/dL (ref 0–149)
VLDL Cholesterol Cal: 25 mg/dL (ref 5–40)

## 2023-06-24 LAB — HEMOGLOBIN A1C
Est. average glucose Bld gHb Est-mCnc: 105 mg/dL
Hgb A1c MFr Bld: 5.3 % (ref 4.8–5.6)

## 2023-06-30 ENCOUNTER — Encounter: Payer: Self-pay | Admitting: Family Medicine

## 2023-06-30 ENCOUNTER — Ambulatory Visit (INDEPENDENT_AMBULATORY_CARE_PROVIDER_SITE_OTHER): Payer: Managed Care, Other (non HMO) | Admitting: Family Medicine

## 2023-06-30 VITALS — BP 132/82 | HR 98 | Resp 18 | Ht 65.0 in | Wt 210.0 lb

## 2023-06-30 DIAGNOSIS — K580 Irritable bowel syndrome with diarrhea: Secondary | ICD-10-CM

## 2023-06-30 DIAGNOSIS — F3281 Premenstrual dysphoric disorder: Secondary | ICD-10-CM

## 2023-06-30 DIAGNOSIS — Z0000 Encounter for general adult medical examination without abnormal findings: Secondary | ICD-10-CM | POA: Diagnosis not present

## 2023-06-30 MED ORDER — SERTRALINE HCL 25 MG PO TABS: 25.0000 mg | ORAL_TABLET | Freq: Every day | ORAL | 1 refills | Status: DC

## 2023-06-30 MED ORDER — NORGESTIMATE-ETH ESTRADIOL 0.25-35 MG-MCG PO TABS: 1.0000 | ORAL_TABLET | Freq: Every day | ORAL | 1 refills | Status: DC

## 2023-06-30 NOTE — Assessment & Plan Note (Signed)
Continue Zoloft 25 mg daily and follow-up with GI as needed.

## 2023-06-30 NOTE — Progress Notes (Signed)
Complete physical exam  Patient: Amber Dominguez   DOB: March 04, 1991   32 y.o. Female  MRN: [id number]  Subjective:    Chief Complaint  Patient presents with   Annual Exam    Amber Dominguez is a 32 y.o. female who presents today for a complete physical exam. She reports consuming a general diet. She generally feels well. She reports sleeping fairly well but often struggles with sleep induction. She has also been experiencing an increasing frequency of cramping in different muscles of her legs.   Most recent fall risk assessment:    11/24/2022    2:48 PM  Fall Risk   Falls in the past year? 0  Number falls in past yr: 0  Injury with Fall? 0  Follow up Falls evaluation completed     Most recent depression and anxiety screenings:    06/30/2023    2:59 PM 11/24/2022    2:48 PM  PHQ 2/9 Scores  PHQ - 2 Score 2 2  PHQ- 9 Score 9 9      06/30/2023    2:59 PM 11/24/2022    2:48 PM 08/24/2022    4:21 PM 04/23/2022    3:17 PM  GAD 7 : Generalized Anxiety Score  Nervous, Anxious, on Edge 2 1 3  0  Control/stop worrying 2 2 1  0  Worry too much - different things 2 2 1  0  Trouble relaxing 1 1 1 1   Restless 1 1 0 0  Easily annoyed or irritable 1 1 1 1   Afraid - awful might happen 1 1 3  0  Total GAD 7 Score 10 9 10 2   Anxiety Difficulty Somewhat difficult  Somewhat difficult     Patient Active Problem List   Diagnosis Date Noted   Calculus of gallbladder with acute on chronic cholecystitis without obstruction 04/30/2022   Premenstrual dysphoric disorder 03/15/2022   Irritable bowel syndrome with diarrhea 03/01/2022   BMI 37.0-37.9, adult 03/01/2022   Morbid obesity (HCC) 08/26/2017   Low vitamin D level 06/30/2017    Past Surgical History:  Procedure Laterality Date   TONSILLECTOMY     WISDOM TOOTH EXTRACTION     Social History   Tobacco Use   Smoking status: Never    Passive exposure: Never   Smokeless tobacco: Never  Vaping Use   Vaping status: Never Used   Substance Use Topics   Alcohol use: Yes    Comment: rarely   Drug use: No   Family History  Problem Relation Age of Onset   Rheum arthritis Mother    Heart disease Father    Colon cancer Neg Hx    Esophageal cancer Neg Hx    Stomach cancer Neg Hx    Rectal cancer Neg Hx    Allergies  Allergen Reactions   Cefzil [Cefprozil] Hives    Coughing, fever     Patient Care Team: Melida Quitter, PA as PCP - General (Family Medicine)   Outpatient Medications Prior to Visit  Medication Sig   glycopyrrolate (ROBINUL) 2 MG tablet TAKE 1 TABLET (2 MG TOTAL) BY MOUTH 2 (TWO) TIMES DAILY AS NEEDED (ABDOMINAL PAIN).   [DISCONTINUED] norgestimate-ethinyl estradiol (ORTHO-CYCLEN) 0.25-35 MG-MCG tablet TAKE 1 TABLET BY MOUTH EVERY DAY   [DISCONTINUED] sertraline (ZOLOFT) 25 MG tablet Take 1 tablet (25 mg total) by mouth daily.   No facility-administered medications prior to visit.    Review of Systems  Constitutional:  Negative for chills, fever and malaise/fatigue.  HENT:  Negative for  congestion and hearing loss.   Eyes:  Negative for blurred vision and double vision.  Respiratory:  Negative for cough and shortness of breath.   Cardiovascular:  Negative for chest pain, palpitations and leg swelling.  Gastrointestinal:  Negative for abdominal pain, constipation, diarrhea and heartburn.  Genitourinary:  Negative for frequency and urgency.  Musculoskeletal:  Negative for myalgias and neck pain.  Neurological:  Negative for headaches.  Endo/Heme/Allergies:  Negative for polydipsia.  Psychiatric/Behavioral:  Negative for depression. The patient is not nervous/anxious.       Objective:    BP 132/82 (BP Location: Left Arm, Patient Position: Sitting, Cuff Size: Normal)   Pulse 98   Resp 18   Ht 5\' 5"  (1.651 m)   Wt 210 lb (95.3 kg)   LMP 06/29/2023   SpO2 99%   BMI 34.95 kg/m    Physical Exam Constitutional:      General: She is not in acute distress.    Appearance: Normal  appearance.  HENT:     Head: Normocephalic and atraumatic.     Right Ear: Tympanic membrane, ear canal and external ear normal.     Left Ear: Tympanic membrane, ear canal and external ear normal.     Nose: Nose normal. No congestion or rhinorrhea.     Mouth/Throat:     Mouth: Mucous membranes are moist.     Pharynx: No oropharyngeal exudate or posterior oropharyngeal erythema.  Eyes:     Extraocular Movements: Extraocular movements intact.     Conjunctiva/sclera: Conjunctivae normal.     Pupils: Pupils are equal, round, and reactive to light.     Comments: Wearing contacts, due for updated prescription soon  Neck:     Thyroid: No thyroid mass, thyromegaly or thyroid tenderness.  Cardiovascular:     Rate and Rhythm: Normal rate and regular rhythm.     Heart sounds: Normal heart sounds. No murmur heard.    No friction rub. No gallop.  Pulmonary:     Effort: Pulmonary effort is normal. No respiratory distress.     Breath sounds: Normal breath sounds. No wheezing, rhonchi or rales.  Abdominal:     General: Abdomen is flat. Bowel sounds are normal. There is no distension.     Palpations: There is no mass.     Tenderness: There is no abdominal tenderness. There is no guarding.  Musculoskeletal:        General: Normal range of motion.     Cervical back: Normal range of motion and neck supple.  Lymphadenopathy:     Cervical: No cervical adenopathy.  Skin:    General: Skin is warm and dry.  Neurological:     Mental Status: She is alert and oriented to person, place, and time.     Cranial Nerves: No cranial nerve deficit.     Motor: No weakness.     Deep Tendon Reflexes: Reflexes normal.  Psychiatric:        Mood and Affect: Mood normal.       Assessment & Plan:    Routine Health Maintenance and Physical Exam  Immunization History  Administered Date(s) Administered   PFIZER Comirnaty(Gray Top)Covid-19 Tri-Sucrose Vaccine 11/06/2020   PFIZER(Purple Top)SARS-COV-2 Vaccination  11/06/2019, 11/27/2019   PPD Test 05/28/2016   Tdap 10/21/2017    Health Maintenance  Topic Date Due   Hepatitis C Screening  Never done   COVID-19 Vaccine (4 - 2023-24 season) 05/16/2023   INFLUENZA VACCINE  12/13/2023 (Originally 04/15/2023)   Cervical Cancer Screening (  HPV/Pap Cotest)  12/11/2026   Colonoscopy  06/11/2027   DTaP/Tdap/Td (2 - Td or Tdap) 10/22/2027   HIV Screening  Completed   HPV VACCINES  Aged Out    Reviewed most recent labs including CBC, CMP, lipid panel, A1C, TSH, and vitamin D. All within normal limits.  Discussed health benefits of physical activity, and encouraged her to engage in regular exercise appropriate for her age and condition.  Wellness examination  Premenstrual dysphoric disorder Assessment & Plan: Stable, patient happy with current routine.  Continue Zoloft 25 mg daily and OCPs.  Will continue to monitor.  Orders: -     Norgestimate-Eth Estradiol; Take 1 tablet by mouth daily.  Dispense: 84 tablet; Refill: 1 -     Sertraline HCl; Take 1 tablet (25 mg total) by mouth daily.  Dispense: 90 tablet; Refill: 1  Irritable bowel syndrome with diarrhea Assessment & Plan: Continue Zoloft 25 mg daily and follow-up with GI as needed.  Orders: -     Sertraline HCl; Take 1 tablet (25 mg total) by mouth daily.  Dispense: 90 tablet; Refill: 1    Return in about 6 months (around 12/29/2023) for follow-up.     Melida Quitter, PA

## 2023-06-30 NOTE — Assessment & Plan Note (Signed)
Stable, patient happy with current routine.  Continue Zoloft 25 mg daily and OCPs.  Will continue to monitor.

## 2023-07-12 NOTE — Progress Notes (Signed)
Amber Dominguez    [id number]    09/03/1991  Primary Care Physician:Edstrom, Simmie Davies, PA  Referring Physician: Carlean Jews, NP [address] [place],  [place] [address]   Chief complaint: No chief complaint on file.   HPI: Amber Dominguez is a 32 y.o. female presenting to clinic today for consult for abdominal pain and bloating.  Last seen on 03-08-23 by NP Willette Cluster for postprandial abdominal pain.   Today,   GI Hx:  Colonoscopy 06-10-22 - One 5 mm polyp in the transverse colon, removed with a cold snare. Resected and retrieved.  - Normal mucosa in the entire examined colon.  - Non-bleeding external and internal hemorrhoids. Surgical [P], colon, transverse, polyp (1) - SESSILE SERRATED POLYP WITHOUT CYTOLOGIC DYSPLASIA  EGD 06-10-22 - Z-line regular, 36 cm from the incisors.  - Normal esophagus.  - Normal stomach.  - Normal examined duodenum.  - No specimens collected.  US Abdomen Limited RUQ 03-13-22 Linear echogenicity in the gallbladder fossa with posterior acoustic shadowing. This "wall echo shadow" complex is most commonly seen with a contracted, stone filled gallbladder as in chronic cholecystitis. No evidence of acute superimposed inflammation.   Moderate hepatic steatosis.   Current Outpatient Medications:    glycopyrrolate (ROBINUL) 2 MG tablet, TAKE 1 TABLET (2 MG TOTAL) BY MOUTH 2 (TWO) TIMES DAILY AS NEEDED (ABDOMINAL PAIN)., Disp: 180 tablet, Rfl: 1   norgestimate-ethinyl estradiol (ORTHO-CYCLEN) 0.25-35 MG-MCG tablet, Take 1 tablet by mouth daily., Disp: 84 tablet, Rfl: 1   sertraline (ZOLOFT) 25 MG tablet, Take 1 tablet (25 mg total) by mouth daily., Disp: 90 tablet, Rfl: 1   Allergies as of 07/13/2023 - Review Complete 06/30/2023  Allergen Reaction Noted   Cefzil [cefprozil] Hives 05/19/2017    Past Medical History:  Diagnosis Date   Anxiety 2023   Chlamydia    Chronic hypertension complicating pregnancy,  antepartum 06/28/2017   [ X] Aspirin 81 mg daily after 12 weeks; discontinue after 36 weeks Current antihypertensives:  None   Baseline and surveillance labs (pulled in from [facility], refresh links as needed)  Lab Results  Component Value Date   PLT 246 05/19/2017    Antenatal Testing CHTN - O10.919  Group I  BP < 140/90, no preeclampsia, AGA,  nml AFV, +/- meds    Group II BP > 140/90, on meds, no preeclampsia, AGA, nml AFV   Headache    High blood pressure 05/2017   Hypertension     Past Surgical History:  Procedure Laterality Date   TONSILLECTOMY     WISDOM TOOTH EXTRACTION      Family History  Problem Relation Age of Onset   Rheum arthritis Mother    Heart disease Father    Colon cancer Neg Hx    Esophageal cancer Neg Hx    Stomach cancer Neg Hx    Rectal cancer Neg Hx     Social History   Socioeconomic History   Marital status: Married    Spouse name: Not on file   Number of children: 2   Years of education: Not on file   Highest education level: Not on file  Occupational History   Not on file  Tobacco Use   Smoking status: Never    Passive exposure: Never   Smokeless tobacco: Never  Vaping Use   Vaping status: Never Used  Substance and Sexual Activity   Alcohol use: Yes  Comment: rarely   Drug use: No   Sexual activity: Yes    Birth control/protection: None  Other Topics Concern   Not on file  Social History Narrative   Not on file   Social Determinants of Health   Financial Resource Strain: Not on file  Food Insecurity: Not on file  Transportation Needs: Not on file  Physical Activity: Not on file  Stress: Not on file  Social Connections: Not on file  Intimate Partner Violence: Not on file      Review of systems: Review of Systems    Physical Exam: There were no vitals filed for this visit. There is no height or weight on file to calculate BMI.  General: well-appearing ***  Eyes: sclera anicteric, no redness ENT: oral mucosa moist without  lesions, no cervical or supraclavicular lymphadenopathy CV: RRR, no JVD, no peripheral edema Resp: clear to auscultation bilaterally, normal RR and effort noted GI: soft, no tenderness, with active bowel sounds. No guarding or palpable organomegaly noted. Skin; warm and dry, no rash or jaundice noted Neuro: awake, alert and oriented x 3. Normal gross motor function and fluent speech   Data Reviewed:  Reviewed labs, radiology imaging, old records and pertinent past GI work up   Assessment and Plan/Recommendations:  ***  This visit required *** minutes of patient care (this includes precharting, chart review, review of results, face-to-face time used for counseling as well as treatment plan and follow-up. The patient was provided an opportunity to ask questions and all were answered. The patient agreed with the plan and demonstrated an understanding of the instructions.  Iona Beard , MD  CC: Carlean Jews, NP   Ladona Mow Hewitt Shorts as a scribe for Marsa Aris, MD.,have documented all relevant documentation on the behalf of Marsa Aris, MD,as directed by  Marsa Aris, MD while in the presence of Marsa Aris, MD.   I, Marsa Aris, MD, have reviewed all documentation for this visit. The documentation on 07/12/23 for the exam, diagnosis, procedures, and orders are all accurate and complete.

## 2023-07-13 ENCOUNTER — Encounter: Payer: Self-pay | Admitting: Gastroenterology

## 2023-07-13 ENCOUNTER — Ambulatory Visit (INDEPENDENT_AMBULATORY_CARE_PROVIDER_SITE_OTHER): Payer: Managed Care, Other (non HMO) | Admitting: Gastroenterology

## 2023-07-13 VITALS — BP 104/70 | HR 67 | Ht 65.0 in | Wt 210.2 lb

## 2023-07-13 DIAGNOSIS — K581 Irritable bowel syndrome with constipation: Secondary | ICD-10-CM | POA: Diagnosis not present

## 2023-07-13 DIAGNOSIS — E739 Lactose intolerance, unspecified: Secondary | ICD-10-CM

## 2023-07-13 DIAGNOSIS — R109 Unspecified abdominal pain: Secondary | ICD-10-CM | POA: Diagnosis not present

## 2023-07-13 DIAGNOSIS — R140 Abdominal distension (gaseous): Secondary | ICD-10-CM | POA: Diagnosis not present

## 2023-07-13 MED ORDER — DICYCLOMINE HCL 10 MG PO CAPS: 10.0000 mg | ORAL_CAPSULE | Freq: Three times a day (TID) | ORAL | 1 refills | Status: DC | PRN

## 2023-07-13 NOTE — Patient Instructions (Addendum)
VISIT SUMMARY:  During today's visit, we discussed your ongoing gastrointestinal discomfort and constipation. You reported persistent mid-abdominal pain, which worsens during constipation and before bowel movements. We also reviewed your history of a colon polyp, which was removed, and your follow-up schedule for colonoscopy.  YOUR PLAN:  -ABDOMINAL PAIN AND CONSTIPATION: Your abdominal pain and constipation are likely due to irregular bowel habits. To help manage this, start taking a daily stool softener (Docusate) at bedtime, increase your dietary fiber intake with Benefiber (1 tablespoon with each meal), and drink 8 cups of water per day. We will also try a lactose-free diet for one week to see if lactose intolerance is contributing to your symptoms.  -PRESCRIPTION MEDICATION: Bentyl (Dicyclomine) has been prescribed to help with abdominal cramping. You can take it as needed, up to three times a day.  -COLON POLYP: You have a history of a small polyp that was removed during a colonoscopy. Your next colonoscopy is due in 2028, and it is important to continue with routine screenings as scheduled.  INSTRUCTIONS:  Please follow the plan outlined for managing your abdominal pain and constipation. Start the daily stool softener, increase your fiber and water intake, and try the lactose-free diet for one week. Use Bentyl as needed for cramping. Your next colonoscopy is due in 2028, so continue with routine screenings.  We have sent the following medications to your pharmacy for you to pick up at your convenience: Dicyclomine 10 mg  I appreciate the  opportunity to care for you  Thank You   Marsa Aris , MD

## 2023-07-14 ENCOUNTER — Encounter: Payer: Self-pay | Admitting: Gastroenterology

## 2023-08-05 ENCOUNTER — Other Ambulatory Visit: Payer: Self-pay | Admitting: Gastroenterology

## 2023-10-21 ENCOUNTER — Encounter: Payer: Self-pay | Admitting: Family Medicine

## 2023-12-16 ENCOUNTER — Ambulatory Visit: Payer: Managed Care, Other (non HMO) | Admitting: Family Medicine

## 2023-12-29 ENCOUNTER — Ambulatory Visit: Payer: Managed Care, Other (non HMO) | Admitting: Family Medicine

## 2024-02-10 ENCOUNTER — Other Ambulatory Visit: Payer: Self-pay | Admitting: Family Medicine

## 2024-02-10 DIAGNOSIS — F3281 Premenstrual dysphoric disorder: Secondary | ICD-10-CM

## 2024-03-13 ENCOUNTER — Ambulatory Visit (INDEPENDENT_AMBULATORY_CARE_PROVIDER_SITE_OTHER)

## 2024-03-13 VITALS — BP 110/69 | HR 68 | Ht 65.0 in | Wt 218.2 lb

## 2024-03-13 DIAGNOSIS — K580 Irritable bowel syndrome with diarrhea: Secondary | ICD-10-CM | POA: Diagnosis not present

## 2024-03-13 DIAGNOSIS — F3281 Premenstrual dysphoric disorder: Secondary | ICD-10-CM

## 2024-03-13 DIAGNOSIS — R42 Dizziness and giddiness: Secondary | ICD-10-CM | POA: Diagnosis not present

## 2024-03-13 MED ORDER — SERTRALINE HCL 50 MG PO TABS: 50.0000 mg | ORAL_TABLET | Freq: Every day | ORAL | 3 refills | Status: DC

## 2024-03-13 NOTE — Assessment & Plan Note (Signed)
 Symptoms have improved greatly.  Have increase Zoloft  to 50 mg daily.  Patient follows with GI.  Recommend follow-up as scheduled with GI.  Continue Bentyl  as needed for stomach spasm.  Will continue to monitor.

## 2024-03-13 NOTE — Patient Instructions (Signed)
 It was nice to see you today!  As we discussed in clinic:  -I have sent in the increased dose of Zoloft  (50 mg) to your pharmacy.  -I would recommend increasing your daily water intake to a minimum of 80 oz of water per day (~4 standard sized plastic water bottles) to support blood pressure and prevent dizziness. Gatorade or Powerade is also okay to drink for extra electrolytes. If the dizziness does not improve, please let me know.   I will plan on seeing you back in 3 months for a follow up/physical exam with labs the week before. It was nice to meet you!  If you have any problems before your next visit feel free to message me via MyChart (minor issues or questions) or call the office, otherwise you may reach out to schedule an office visit.  Thank you! Saddie Sacks, PA-C

## 2024-03-13 NOTE — Progress Notes (Signed)
 Established Patient Office Visit  Subjective   Patient ID: Amber Dominguez, female    DOB: 03/19/1991  Age: 33 y.o. MRN: [id number]  Chief Complaint  Patient presents with   Follow up Mood    HPI  Amber Dominguez is a 33 y.o. y/o female who presents to the clinic today for follow up on mood/PMDD. Patient currently managing with OCP and zoloft  25 mg daily. Reports that she is tolerating these medications well. Reports periods are more manageable on the birth control. Also reports that her periods are starting to become more regular/lighter now that she's on the OCP. Does report that she feels her agitation is improved with the Zoloft , but she would like to increase to dose slightly for better control of her symptoms.  Also reports that she has felt dizzy over the last 3-4 weeks. Reports it will hit her randomly and last a few seconds to minutes. Reports the sensation that she is on a rollercoaster. Denies sensation that room is spinning. Denies vision changes other than difficulty focusing when these episodes occur. Denies identifiable triggers. Reports that she drinks 20-40 oz of water per day. Denies ear ringing or syncope.    ROS Per HPI.    Objective:     BP 110/69   Pulse 68   Ht 5' 5 (1.651 m)   Wt 218 lb 4 oz (99 kg)   SpO2 98%   BMI 36.32 kg/m    Physical Exam Constitutional:      General: She is not in acute distress.    Appearance: Normal appearance.   Cardiovascular:     Rate and Rhythm: Normal rate and regular rhythm.     Heart sounds: Normal heart sounds. No murmur heard.    No friction rub. No gallop.  Pulmonary:     Effort: Pulmonary effort is normal. No respiratory distress.     Breath sounds: Normal breath sounds.   Musculoskeletal:        General: No swelling.   Skin:    General: Skin is warm and dry.   Neurological:     General: No focal deficit present.     Mental Status: She is alert.   Psychiatric:        Mood and Affect: Mood  normal.        Behavior: Behavior normal.        Thought Content: Thought content normal.    No results found for any visits on 03/13/24.    The ASCVD Risk score (Arnett DK, et al., 2019) failed to calculate for the following reasons:   The 2019 ASCVD risk score is only valid for ages 39 to 31    Assessment & Plan:   Premenstrual dysphoric disorder Assessment & Plan: Stable.  Have increase Zoloft  to 50 mg daily per patient request.  Continue OCP as prescribed.  Will continue to monitor.   Irritable bowel syndrome with diarrhea Assessment & Plan: Symptoms have improved greatly.  Have increase Zoloft  to 50 mg daily.  Patient follows with GI.  Recommend follow-up as scheduled with GI.  Continue Bentyl  as needed for stomach spasm.  Will continue to monitor.   Dizziness Assessment & Plan: Discussed etiologies of dizziness including anemia secondary to menstrual blood loss and low blood pressure.  Given that patient's last hemoglobin/red blood cell count was within normal limits and period are lighter and now that she is on an OCP, suspect that dizziness is related to postural changes and low blood pressure.  Encourage patient to encourage her water intake from 40 ounces a day to a minimum of 80 ounces per day.  Advised her that it is okay to supplement fluid intake with Gatorade/Powerade for extra electrolytes.  Instructed patient to notify if symptoms worsen or do not improve after increasing her water intake.   Other orders -     Sertraline  HCl; Take 1 tablet (50 mg total) by mouth daily.  Dispense: 30 tablet; Refill: 3    Return in about 4 months (around 07/13/2024) for Physical, Mood.    Saddie JULIANNA Sacks, PA-C

## 2024-03-13 NOTE — Assessment & Plan Note (Signed)
 Stable.  Have increase Zoloft  to 50 mg daily per patient request.  Continue OCP as prescribed.  Will continue to monitor.

## 2024-03-13 NOTE — Assessment & Plan Note (Signed)
 Discussed etiologies of dizziness including anemia secondary to menstrual blood loss and low blood pressure.  Given that patient's last hemoglobin/red blood cell count was within normal limits and period are lighter and now that she is on an OCP, suspect that dizziness is related to postural changes and low blood pressure.  Encourage patient to encourage her water intake from 40 ounces a day to a minimum of 80 ounces per day.  Advised her that it is okay to supplement fluid intake with Gatorade/Powerade for extra electrolytes.  Instructed patient to notify if symptoms worsen or do not improve after increasing her water intake.

## 2024-03-15 ENCOUNTER — Other Ambulatory Visit: Payer: Self-pay | Admitting: Family Medicine

## 2024-03-15 DIAGNOSIS — K580 Irritable bowel syndrome with diarrhea: Secondary | ICD-10-CM

## 2024-03-15 DIAGNOSIS — F3281 Premenstrual dysphoric disorder: Secondary | ICD-10-CM

## 2024-04-09 ENCOUNTER — Other Ambulatory Visit: Payer: Self-pay

## 2024-06-30 ENCOUNTER — Other Ambulatory Visit: Payer: Self-pay

## 2024-06-30 DIAGNOSIS — Z1159 Encounter for screening for other viral diseases: Secondary | ICD-10-CM

## 2024-07-06 ENCOUNTER — Other Ambulatory Visit

## 2024-07-06 DIAGNOSIS — Z1159 Encounter for screening for other viral diseases: Secondary | ICD-10-CM

## 2024-07-07 ENCOUNTER — Ambulatory Visit: Payer: Self-pay

## 2024-07-07 LAB — COMPREHENSIVE METABOLIC PANEL WITH GFR
ALT: 23 IU/L (ref 0–32)
AST: 18 IU/L (ref 0–40)
Albumin: 4.4 g/dL (ref 3.9–4.9)
Alkaline Phosphatase: 80 IU/L (ref 41–116)
BUN/Creatinine Ratio: 18 (ref 9–23)
BUN: 13 mg/dL (ref 6–20)
Bilirubin Total: 0.2 mg/dL (ref 0.0–1.2)
CO2: 23 mmol/L (ref 20–29)
Calcium: 9.5 mg/dL (ref 8.7–10.2)
Chloride: 104 mmol/L (ref 96–106)
Creatinine, Ser: 0.74 mg/dL (ref 0.57–1.00)
Globulin, Total: 3.2 g/dL (ref 1.5–4.5)
Glucose: 83 mg/dL (ref 70–99)
Potassium: 4.3 mmol/L (ref 3.5–5.2)
Sodium: 141 mmol/L (ref 134–144)
Total Protein: 7.6 g/dL (ref 6.0–8.5)
eGFR: 109 mL/min/1.73 (ref >59)

## 2024-07-07 LAB — CBC WITH DIFFERENTIAL/PLATELET
Basophils Absolute: 0.1 x10E3/uL (ref 0.0–0.2)
Basos: 1 %
EOS (ABSOLUTE): 0.1 x10E3/uL (ref 0.0–0.4)
Eos: 1 %
Hematocrit: 39.6 % (ref 34.0–46.6)
Hemoglobin: 13 g/dL (ref 11.1–15.9)
Immature Grans (Abs): 0 x10E3/uL (ref 0.0–0.1)
Immature Granulocytes: 0 %
Lymphocytes Absolute: 2.1 x10E3/uL (ref 0.7–3.1)
Lymphs: 31 %
MCH: 29.9 pg (ref 26.6–33.0)
MCHC: 32.8 g/dL (ref 31.5–35.7)
MCV: 91 fL (ref 79–97)
Monocytes Absolute: 0.4 x10E3/uL (ref 0.1–0.9)
Monocytes: 6 %
Neutrophils Absolute: 4.1 x10E3/uL (ref 1.4–7.0)
Neutrophils: 61 %
Platelets: 305 x10E3/uL (ref 150–450)
RBC: 4.35 x10E6/uL (ref 3.77–5.28)
RDW: 11.7 % (ref 11.7–15.4)
WBC: 6.7 x10E3/uL (ref 3.4–10.8)

## 2024-07-07 LAB — TSH: TSH: 1.08 u[IU]/mL (ref 0.450–4.500)

## 2024-07-07 LAB — LIPID PANEL
Chol/HDL Ratio: 3.4 ratio (ref 0.0–4.4)
Cholesterol, Total: 181 mg/dL (ref 100–199)
HDL: 53 mg/dL (ref >39)
LDL Chol Calc (NIH): 82 mg/dL (ref 0–99)
Triglycerides: 285 mg/dL — ABNORMAL HIGH (ref 0–149)
VLDL Cholesterol Cal: 46 mg/dL — ABNORMAL HIGH (ref 5–40)

## 2024-07-07 LAB — VITAMIN D 25 HYDROXY (VIT D DEFICIENCY, FRACTURES): Vit D, 25-Hydroxy: 42.8 ng/mL (ref 30.0–100.0)

## 2024-07-07 LAB — HEPATITIS C ANTIBODY: Hep C Virus Ab: NONREACTIVE

## 2024-07-07 LAB — HEMOGLOBIN A1C
Est. average glucose Bld gHb Est-mCnc: 100 mg/dL
Hgb A1c MFr Bld: 5.1 % (ref 4.8–5.6)

## 2024-07-13 ENCOUNTER — Encounter

## 2024-07-24 ENCOUNTER — Ambulatory Visit (INDEPENDENT_AMBULATORY_CARE_PROVIDER_SITE_OTHER)

## 2024-07-24 VITALS — BP 121/74 | HR 71 | Temp 98.1°F | Ht 65.0 in | Wt 219.0 lb

## 2024-07-24 DIAGNOSIS — R42 Dizziness and giddiness: Secondary | ICD-10-CM

## 2024-07-24 DIAGNOSIS — F3281 Premenstrual dysphoric disorder: Secondary | ICD-10-CM

## 2024-07-24 DIAGNOSIS — E781 Pure hyperglyceridemia: Secondary | ICD-10-CM | POA: Insufficient documentation

## 2024-07-24 DIAGNOSIS — Z0000 Encounter for general adult medical examination without abnormal findings: Secondary | ICD-10-CM | POA: Insufficient documentation

## 2024-07-24 DIAGNOSIS — H8110 Benign paroxysmal vertigo, unspecified ear: Secondary | ICD-10-CM | POA: Insufficient documentation

## 2024-07-24 NOTE — Assessment & Plan Note (Signed)
 Stable and well controlled. Continue Zoloft  50 mg and birth control as prescribed.

## 2024-07-24 NOTE — Assessment & Plan Note (Signed)
 Last lipid panel: LDL 82, HDL 53, Trig 285. Will recheck to confirm elevation of triglycerides with fasting lipid panel. If they remain this elevated, would recommend starting a low dose of Tricor (48 mg) to target triglycerides and reduce risk of pancreatitis.

## 2024-07-24 NOTE — Progress Notes (Signed)
 Complete physical exam  Patient: Amber Dominguez   DOB: 28-Apr-1991   33 y.o. Female  MRN: [id number]  Subjective:    Chief Complaint  Patient presents with   Annual Exam   History of Present Illness   Amber Dominguez is a 33 year old female who presents for an annual physical exam and follow-up on lab results.  Lipid abnormalities - Recent cholesterol panel showed elevated triglycerides compared to previous values - Patient reports that she did eat a marshmallow before the labs were done, but was otherwise fasting for these labs    Mood disturbance - Currently taking Zoloft  - Mood has improved, with some additional improvement during menstrual periods  Menstrual irregularity - On birth control - Periods are overall regular and happening on the 4th week of pill pack every month   Sleep disturbance - Difficulty initiating sleep - Daytime fatigue but inability to sleep at night - Magnesium glycinate provides some benefit - Melatonin less effective  Dizziness and vertigo - Intermittent dizziness when sitting or lying down - Sensation of spinning not associated with positional changes - No episodes of pre-syncope/syncope. No vision changes. No tinnitus.      Most recent fall risk assessment:    07/24/2024    3:41 PM  Fall Risk   Falls in the past year? 1  Follow up Falls evaluation completed     Most recent depression screenings:    07/24/2024    3:41 PM 03/13/2024    3:51 PM  PHQ 2/9 Scores  PHQ - 2 Score 0 2  PHQ- 9 Score 9 8      Data saved with a previous flowsheet row definition    Vision:Within last year and Dental: No current dental problems and Receives regular dental care    Patient Care Team: Gayle Saddie JULIANNA DEVONNA as PCP - General (Physician Assistant)   Outpatient Medications Prior to Visit  Medication Sig   norgestimate -ethinyl estradiol  (ORTHO-CYCLEN) 0.25-35 MG-MCG tablet TAKE 1 TABLET BY MOUTH EVERY DAY   sertraline  (ZOLOFT ) 50 MG  tablet TAKE 1 TABLET BY MOUTH EVERY DAY   No facility-administered medications prior to visit.    ROS  Per HPI      Objective:     BP 121/74   Pulse 71   Temp 98.1 F (36.7 C) (Oral)   Ht 5' 5 (1.651 m)   Wt 219 lb 0.6 oz (99.4 kg)   LMP 07/10/2024   SpO2 97%   BMI 36.45 kg/m    Physical Exam Constitutional:      General: She is not in acute distress.    Appearance: Normal appearance.  HENT:     Right Ear: Tympanic membrane normal.     Left Ear: Tympanic membrane normal.     Mouth/Throat:     Mouth: Mucous membranes are moist.     Pharynx: Oropharynx is clear. No oropharyngeal exudate.  Eyes:     Extraocular Movements: Extraocular movements intact.     Pupils: Pupils are equal, round, and reactive to light.     Comments: No nystagmus noted on exam today  Cardiovascular:     Rate and Rhythm: Normal rate and regular rhythm.     Heart sounds: Normal heart sounds. No murmur heard.    No friction rub. No gallop.  Pulmonary:     Effort: Pulmonary effort is normal. No respiratory distress.     Breath sounds: Normal breath sounds.  Abdominal:     General: Bowel sounds are  normal.  Musculoskeletal:        General: No swelling.     Cervical back: Neck supple.  Lymphadenopathy:     Cervical: No cervical adenopathy.  Skin:    General: Skin is warm and dry.  Neurological:     General: No focal deficit present.     Mental Status: She is alert.  Psychiatric:        Mood and Affect: Mood normal.        Behavior: Behavior normal.        Thought Content: Thought content normal.       No results found for any visits on 07/24/24. Last CBC Lab Results  Component Value Date   WBC 6.7 07/06/2024   HGB 13.0 07/06/2024   HCT 39.6 07/06/2024   MCV 91 07/06/2024   MCH 29.9 07/06/2024   RDW 11.7 07/06/2024   PLT 305 07/06/2024   Last metabolic panel Lab Results  Component Value Date   GLUCOSE 83 07/06/2024   NA 141 07/06/2024   K 4.3 07/06/2024   CL 104  07/06/2024   CO2 23 07/06/2024   BUN 13 07/06/2024   CREATININE 0.74 07/06/2024   EGFR 109 07/06/2024   CALCIUM 9.5 07/06/2024   PROT 7.6 07/06/2024   ALBUMIN 4.4 07/06/2024   LABGLOB 3.2 07/06/2024   AGRATIO 1.5 03/02/2022   BILITOT 0.2 07/06/2024   ALKPHOS 80 07/06/2024   AST 18 07/06/2024   ALT 23 07/06/2024   ANIONGAP 16 (H) 08/05/2022   Last lipids Lab Results  Component Value Date   CHOL 181 07/06/2024   HDL 53 07/06/2024   LDLCALC 82 07/06/2024   TRIG 285 (H) 07/06/2024   CHOLHDL 3.4 07/06/2024   Last hemoglobin A1c Lab Results  Component Value Date   HGBA1C 5.1 07/06/2024   Last thyroid  functions Lab Results  Component Value Date   TSH 1.080 07/06/2024   FREET4 1.17 03/02/2022   Last vitamin D  Lab Results  Component Value Date   VD25OH 42.8 07/06/2024        Assessment & Plan:    Routine Health Maintenance and Physical Exam  Health Maintenance  Topic Date Due   Hepatitis B Vaccine (1 of 3 - 19+ 3-dose series) Never done   HPV Vaccine (1 - 3-dose SCDM series) Never done   COVID-19 Vaccine (4 - 2025-26 season) 05/15/2024   Flu Shot  12/12/2024*   Pap with HPV screening  12/11/2026   Colon Cancer Screening  06/11/2027   DTaP/Tdap/Td vaccine (2 - Td or Tdap) 10/22/2027   Hepatitis C Screening  Completed   HIV Screening  Completed   Pneumococcal Vaccine  Aged Out   Meningitis B Vaccine  Aged Out  *Topic was postponed. The date shown is not the original due date.    Discussed health benefits of physical activity, and encouraged her to engage in regular exercise appropriate for her age and condition.  Hypertriglyceridemia Assessment & Plan: Last lipid panel: LDL 82, HDL 53, Trig 285. Will recheck to confirm elevation of triglycerides with fasting lipid panel. If they remain this elevated, would recommend starting a low dose of Tricor (48 mg) to target triglycerides and reduce risk of pancreatitis.   Orders: -     Lipid panel;  Future  Dizziness -     Ambulatory referral to Physical Therapy  Morbid obesity (HCC) Assessment & Plan: Continue to work on well balanced high protein, high fiber diet and regular exercise. Will address hypertriglyceridemia after confirming  elevation with repeat lab work.   Premenstrual dysphoric disorder Assessment & Plan: Stable and well controlled. Continue Zoloft  50 mg and birth control as prescribed.   Benign paroxysmal positional vertigo, unspecified laterality Assessment & Plan: Intermittent dizziness suggestive of vertigo due to spinning sensation and occurring when lying down or sitting. No nystagmus observed. - Anemia ruled out as cause of dizziness.  - Electrolyte abnormalities ruled out as cause of dizziness.  - Unlikely to be orthostatic hypotension given that symptoms occur at rest.  - Referral to vestibular physical therapy for Epley maneuver. - Advised to confirm insurance coverage and copay with therapy center. - Instructed to update via MyChart if further referral is needed.   Wellness examination Assessment & Plan: Routine health maintenance discussed. Pap smear and colonoscopy due in 2028. Tetanus up to date. Flu shot discussed, declined. - Scheduled next physical exam in one year. - Ensured Pap smear and colonoscopy planned for 2028. - Discussed flu shot option, patient declined.      Return in about 1 year (around 07/24/2025) for Physical.     Saddie JULIANNA Sacks, PA-C

## 2024-07-24 NOTE — Assessment & Plan Note (Signed)
 Intermittent dizziness suggestive of vertigo due to spinning sensation and occurring when lying down or sitting. No nystagmus observed. - Anemia ruled out as cause of dizziness.  - Electrolyte abnormalities ruled out as cause of dizziness.  - Unlikely to be orthostatic hypotension given that symptoms occur at rest.  - Referral to vestibular physical therapy for Epley maneuver. - Advised to confirm insurance coverage and copay with therapy center. - Instructed to update via MyChart if further referral is needed.

## 2024-07-24 NOTE — Assessment & Plan Note (Signed)
 Routine health maintenance discussed. Pap smear and colonoscopy due in 2028. Tetanus up to date. Flu shot discussed, declined. - Scheduled next physical exam in one year. - Ensured Pap smear and colonoscopy planned for 2028. - Discussed flu shot option, patient declined.

## 2024-07-24 NOTE — Assessment & Plan Note (Signed)
 Continue to work on well balanced high protein, high fiber diet and regular exercise. Will address hypertriglyceridemia after confirming elevation with repeat lab work.

## 2024-07-24 NOTE — Patient Instructions (Addendum)
 VISIT SUMMARY: Today, you had your annual physical exam and we reviewed your recent lab results. We discussed your musculoskeletal injury, cholesterol levels, thyroid  function, vitamin D  status, mood, menstrual cycle, sleep issues, dizziness, and allergies.  YOUR PLAN: BENIGN PAROXYSMAL POSITIONAL VERTIGO: You have intermittent dizziness that may be due to loose particles in your inner ear. -You are referred to vestibular physical therapy for the Epley maneuver. Their phone number is [phone] -Please confirm your insurance coverage and copay with the therapy center. -Update us  via MyChart if you need further referral.  HYPERTRIGLYCERIDEMIA: Your recent cholesterol panel showed elevated triglycerides, possibly due to a non-fasting state. -You are scheduled for a fasting lab in four weeks to recheck your triglycerides. -Consume lean foods the night before and fast on the morning of the lab test. -We will discuss fenofibrate treatment if your triglycerides remain elevated.  DEPRESSION: Your mood has improved with Zoloft , and you have no side effects. -Continue your current dosage of Zoloft . -Monitor your mood and menstrual cycle. -Contact us  if you are considering a dosage increase.  SEASONAL ALLERGIC RHINITIS: You have seasonal allergies that flare up in spring and summer. -Continue managing your symptoms as needed.  GENERAL HEALTH MAINTENANCE: We discussed routine health maintenance. -Your next physical exam is scheduled for one year from now. -Pap smear and colonoscopy are scheduled for 2028. -We discussed the flu shot, which you declined.  If you have any problems before your next visit feel free to message me via MyChart (minor issues or questions) or call the office, otherwise you may reach out to schedule an office visit.  Thank you! Saddie Sacks, PA-C

## 2024-08-04 ENCOUNTER — Other Ambulatory Visit: Payer: Self-pay

## 2024-08-04 DIAGNOSIS — F3281 Premenstrual dysphoric disorder: Secondary | ICD-10-CM

## 2024-08-21 ENCOUNTER — Other Ambulatory Visit

## 2024-08-21 DIAGNOSIS — E781 Pure hyperglyceridemia: Secondary | ICD-10-CM

## 2024-08-31 ENCOUNTER — Other Ambulatory Visit: Payer: Self-pay

## 2024-09-01 ENCOUNTER — Ambulatory Visit: Payer: Self-pay

## 2024-09-01 LAB — LIPID PANEL
Chol/HDL Ratio: 2.7 ratio (ref 0.0–4.4)
Cholesterol, Total: 173 mg/dL (ref 100–199)
HDL: 64 mg/dL
LDL Chol Calc (NIH): 87 mg/dL (ref 0–99)
Triglycerides: 129 mg/dL (ref 0–149)
VLDL Cholesterol Cal: 22 mg/dL (ref 5–40)

## 2025-07-18 ENCOUNTER — Other Ambulatory Visit

## 2025-07-25 ENCOUNTER — Encounter
